# Patient Record
Sex: Female | Born: 1940
Health system: Southern US, Community
[De-identification: ages and names within clinical notes are randomized; demographics above are authoritative.]

## PROBLEM LIST (undated history)

## (undated) DIAGNOSIS — D649 Anemia, unspecified: Secondary | ICD-10-CM

## (undated) DIAGNOSIS — E785 Hyperlipidemia, unspecified: Secondary | ICD-10-CM

## (undated) DIAGNOSIS — R0600 Dyspnea, unspecified: Secondary | ICD-10-CM

## (undated) DIAGNOSIS — I38 Endocarditis, valve unspecified: Secondary | ICD-10-CM

## (undated) DIAGNOSIS — E039 Hypothyroidism, unspecified: Secondary | ICD-10-CM

## (undated) DIAGNOSIS — E559 Vitamin D deficiency, unspecified: Secondary | ICD-10-CM

## (undated) DIAGNOSIS — F32A Depression, unspecified: Secondary | ICD-10-CM

## (undated) DIAGNOSIS — R51 Headache: Secondary | ICD-10-CM

## (undated) DIAGNOSIS — M810 Age-related osteoporosis without current pathological fracture: Secondary | ICD-10-CM

## (undated) DIAGNOSIS — I509 Heart failure, unspecified: Secondary | ICD-10-CM

## (undated) DIAGNOSIS — I10 Essential (primary) hypertension: Secondary | ICD-10-CM

## (undated) DIAGNOSIS — R519 Headache, unspecified: Secondary | ICD-10-CM

## (undated) DIAGNOSIS — M1990 Unspecified osteoarthritis, unspecified site: Secondary | ICD-10-CM

## (undated) DIAGNOSIS — J189 Pneumonia, unspecified organism: Secondary | ICD-10-CM

## (undated) DIAGNOSIS — E7800 Pure hypercholesterolemia, unspecified: Secondary | ICD-10-CM

## (undated) DIAGNOSIS — J439 Emphysema, unspecified: Secondary | ICD-10-CM

## (undated) DIAGNOSIS — G4730 Sleep apnea, unspecified: Secondary | ICD-10-CM

## (undated) DIAGNOSIS — H9319 Tinnitus, unspecified ear: Secondary | ICD-10-CM

## (undated) DIAGNOSIS — E119 Type 2 diabetes mellitus without complications: Secondary | ICD-10-CM

## (undated) DIAGNOSIS — K219 Gastro-esophageal reflux disease without esophagitis: Secondary | ICD-10-CM

## (undated) DIAGNOSIS — F419 Anxiety disorder, unspecified: Secondary | ICD-10-CM

## (undated) DIAGNOSIS — J449 Chronic obstructive pulmonary disease, unspecified: Secondary | ICD-10-CM

## (undated) DIAGNOSIS — M797 Fibromyalgia: Secondary | ICD-10-CM

## (undated) DIAGNOSIS — F329 Major depressive disorder, single episode, unspecified: Secondary | ICD-10-CM

## (undated) DIAGNOSIS — F0390 Unspecified dementia without behavioral disturbance: Secondary | ICD-10-CM

## (undated) HISTORY — PX: BREAST BIOPSY: SHX20

## (undated) HISTORY — PX: HEAD & NECK SKIN LESION EXCISIONAL BIOPSY: SUR472

## (undated) HISTORY — PX: KNEE SURGERY: SHX244

## (undated) HISTORY — PX: BREAST SURGERY: SHX581

## (undated) HISTORY — PX: OTHER SURGICAL HISTORY: SHX169

## (undated) HISTORY — PX: CHOLECYSTECTOMY: SHX55

## (undated) HISTORY — PX: ABDOMINAL HYSTERECTOMY: SHX81

## (undated) HISTORY — DX: Hyperlipidemia, unspecified: E78.5

## (undated) HISTORY — PX: TONSILLECTOMY: SUR1361

## (undated) HISTORY — PX: EYE SURGERY: SHX253

---

## 2001-01-02 ENCOUNTER — Encounter: Admission: RE | Admit: 2001-01-02 | Discharge: 2001-01-02 | Payer: Self-pay | Admitting: Internal Medicine

## 2011-08-18 ENCOUNTER — Other Ambulatory Visit: Payer: Self-pay | Admitting: *Deleted

## 2011-08-18 MED ORDER — SIMVASTATIN 20 MG PO TABS: 20.0000 mg | ORAL_TABLET | Freq: Every day | ORAL | Status: DC

## 2011-08-18 NOTE — Telephone Encounter (Signed)
Patient has not been see here. Is it okay to fill this?

## 2011-08-18 NOTE — Telephone Encounter (Signed)
OK to fill but she will need to be seen in visit.

## 2011-10-20 ENCOUNTER — Other Ambulatory Visit: Payer: Self-pay | Admitting: Internal Medicine

## 2013-07-05 ENCOUNTER — Encounter (HOSPITAL_BASED_OUTPATIENT_CLINIC_OR_DEPARTMENT_OTHER): Payer: Self-pay | Admitting: Emergency Medicine

## 2013-07-05 ENCOUNTER — Emergency Department (HOSPITAL_BASED_OUTPATIENT_CLINIC_OR_DEPARTMENT_OTHER)
Admission: EM | Admit: 2013-07-05 | Discharge: 2013-07-05 | Disposition: A | Discharge status: Home / Self Care | Payer: Medicare Other | Attending: Emergency Medicine | Admitting: Emergency Medicine

## 2013-07-05 ENCOUNTER — Emergency Department (HOSPITAL_BASED_OUTPATIENT_CLINIC_OR_DEPARTMENT_OTHER): Payer: Medicare Other

## 2013-07-05 DIAGNOSIS — Z87891 Personal history of nicotine dependence: Secondary | ICD-10-CM | POA: Insufficient documentation

## 2013-07-05 DIAGNOSIS — I10 Essential (primary) hypertension: Secondary | ICD-10-CM | POA: Insufficient documentation

## 2013-07-05 DIAGNOSIS — J438 Other emphysema: Secondary | ICD-10-CM | POA: Insufficient documentation

## 2013-07-05 DIAGNOSIS — K5289 Other specified noninfective gastroenteritis and colitis: Secondary | ICD-10-CM | POA: Insufficient documentation

## 2013-07-05 DIAGNOSIS — E7800 Pure hypercholesterolemia, unspecified: Secondary | ICD-10-CM | POA: Insufficient documentation

## 2013-07-05 DIAGNOSIS — E119 Type 2 diabetes mellitus without complications: Secondary | ICD-10-CM | POA: Insufficient documentation

## 2013-07-05 DIAGNOSIS — Z79899 Other long term (current) drug therapy: Secondary | ICD-10-CM | POA: Insufficient documentation

## 2013-07-05 DIAGNOSIS — IMO0002 Reserved for concepts with insufficient information to code with codable children: Secondary | ICD-10-CM | POA: Insufficient documentation

## 2013-07-05 DIAGNOSIS — K219 Gastro-esophageal reflux disease without esophagitis: Secondary | ICD-10-CM | POA: Insufficient documentation

## 2013-07-05 DIAGNOSIS — E039 Hypothyroidism, unspecified: Secondary | ICD-10-CM | POA: Insufficient documentation

## 2013-07-05 DIAGNOSIS — Z90710 Acquired absence of both cervix and uterus: Secondary | ICD-10-CM | POA: Insufficient documentation

## 2013-07-05 DIAGNOSIS — K529 Noninfective gastroenteritis and colitis, unspecified: Secondary | ICD-10-CM

## 2013-07-05 DIAGNOSIS — Z8739 Personal history of other diseases of the musculoskeletal system and connective tissue: Secondary | ICD-10-CM | POA: Insufficient documentation

## 2013-07-05 HISTORY — DX: Emphysema, unspecified: J43.9

## 2013-07-05 HISTORY — DX: Endocarditis, valve unspecified: I38

## 2013-07-05 HISTORY — DX: Age-related osteoporosis without current pathological fracture: M81.0

## 2013-07-05 HISTORY — DX: Type 2 diabetes mellitus without complications: E11.9

## 2013-07-05 HISTORY — DX: Gastro-esophageal reflux disease without esophagitis: K21.9

## 2013-07-05 HISTORY — DX: Fibromyalgia: M79.7

## 2013-07-05 HISTORY — DX: Pure hypercholesterolemia, unspecified: E78.00

## 2013-07-05 HISTORY — DX: Essential (primary) hypertension: I10

## 2013-07-05 HISTORY — DX: Unspecified osteoarthritis, unspecified site: M19.90

## 2013-07-05 HISTORY — DX: Chronic obstructive pulmonary disease, unspecified: J44.9

## 2013-07-05 HISTORY — DX: Hypothyroidism, unspecified: E03.9

## 2013-07-05 LAB — COMPREHENSIVE METABOLIC PANEL
ALK PHOS: 65 U/L (ref 39–117)
ALT: 13 U/L (ref 0–35)
AST: 18 U/L (ref 0–37)
Albumin: 3.4 g/dL — ABNORMAL LOW (ref 3.5–5.2)
BUN: 20 mg/dL (ref 6–23)
CO2: 23 meq/L (ref 19–32)
Calcium: 10.1 mg/dL (ref 8.4–10.5)
Chloride: 100 mEq/L (ref 96–112)
Creatinine, Ser: 1 mg/dL (ref 0.50–1.10)
GFR calc Af Amer: 64 mL/min — ABNORMAL LOW (ref >90)
GFR, EST NON AFRICAN AMERICAN: 55 mL/min — AB (ref >90)
GLUCOSE: 113 mg/dL — AB (ref 70–99)
POTASSIUM: 3.4 meq/L — AB (ref 3.7–5.3)
SODIUM: 141 meq/L (ref 137–147)
TOTAL PROTEIN: 7.7 g/dL (ref 6.0–8.3)

## 2013-07-05 LAB — CBC WITH DIFFERENTIAL/PLATELET
BLASTS: 0 %
Band Neutrophils: 1 % (ref 0–10)
Basophils Absolute: 0 10*3/uL (ref 0.0–0.1)
Basophils Relative: 0 % (ref 0–1)
EOS PCT: 1 % (ref 0–5)
Eosinophils Absolute: 0.1 10*3/uL (ref 0.0–0.7)
HCT: 40.6 % (ref 36.0–46.0)
Hemoglobin: 13.2 g/dL (ref 12.0–15.0)
Lymphocytes Relative: 23 % (ref 12–46)
Lymphs Abs: 1.5 10*3/uL (ref 0.7–4.0)
MCH: 24.6 pg — AB (ref 26.0–34.0)
MCHC: 32.5 g/dL (ref 30.0–36.0)
MCV: 75.7 fL — ABNORMAL LOW (ref 78.0–100.0)
MONOS PCT: 14 % — AB (ref 3–12)
MYELOCYTES: 0 %
Metamyelocytes Relative: 1 %
Monocytes Absolute: 0.9 10*3/uL (ref 0.1–1.0)
NRBC: 0 /100{WBCs}
Neutro Abs: 4.1 10*3/uL (ref 1.7–7.7)
Neutrophils Relative %: 60 % (ref 43–77)
PLATELETS: 316 10*3/uL (ref 150–400)
Promyelocytes Absolute: 0 %
RBC: 5.36 MIL/uL — AB (ref 3.87–5.11)
RDW: 17.7 % — AB (ref 11.5–15.5)
WBC: 6.6 10*3/uL (ref 4.0–10.5)

## 2013-07-05 LAB — URINALYSIS, ROUTINE W REFLEX MICROSCOPIC
Glucose, UA: NEGATIVE mg/dL
HGB URINE DIPSTICK: NEGATIVE
KETONES UR: 15 mg/dL — AB
Leukocytes, UA: NEGATIVE
NITRITE: NEGATIVE
PROTEIN: 100 mg/dL — AB
SPECIFIC GRAVITY, URINE: 1.038 — AB (ref 1.005–1.030)
Urobilinogen, UA: 1 mg/dL (ref 0.0–1.0)
pH: 6 (ref 5.0–8.0)

## 2013-07-05 LAB — URINE MICROSCOPIC-ADD ON

## 2013-07-05 LAB — LIPASE, BLOOD: LIPASE: 10 U/L — AB (ref 11–59)

## 2013-07-05 MED ORDER — ONDANSETRON 8 MG PO TBDP: 8.0000 mg | ORAL_TABLET | Freq: Three times a day (TID) | ORAL | Status: DC | PRN

## 2013-07-05 MED ORDER — IOHEXOL 300 MG/ML  SOLN
50.0000 mL | Freq: Once | INTRAMUSCULAR | Status: AC | PRN
  Administered 2013-07-05: 50 mL via ORAL

## 2013-07-05 MED ORDER — SODIUM CHLORIDE 0.9 % IV BOLUS (SEPSIS)
1000.0000 mL | Freq: Once | INTRAVENOUS | Status: AC
  Administered 2013-07-05: 1000 mL via INTRAVENOUS

## 2013-07-05 MED ORDER — ONDANSETRON HCL 4 MG/2ML IJ SOLN
4.0000 mg | Freq: Once | INTRAMUSCULAR | Status: AC
  Administered 2013-07-05: 4 mg via INTRAVENOUS
  Filled 2013-07-05: qty 2

## 2013-07-05 MED ORDER — IOHEXOL 300 MG/ML  SOLN
100.0000 mL | Freq: Once | INTRAMUSCULAR | Status: AC | PRN
  Administered 2013-07-05: 100 mL via INTRAVENOUS

## 2013-07-05 MED ORDER — PANTOPRAZOLE SODIUM 40 MG IV SOLR: 40.0000 mg | Freq: Once | INTRAVENOUS | Status: DC

## 2013-07-05 NOTE — ED Notes (Signed)
N/V/D since Wednesday.  Pt had one episode of V/D since this am.  Some fever.

## 2013-07-05 NOTE — ED Provider Notes (Signed)
CSN: [id number]     Arrival date & time 07/05/13  1755 History  This chart was scribed for Hilario Quarry, MD by Blanchard Kelch, ED Scribe. The patient was seen in room MH10/MH10. Patient's care was started at 6:31 PM.    Chief Complaint  Patient presents with  . Emesis  . Diarrhea  . Chest Pain    Patient is a 73 y.o. female presenting with vomiting and diarrhea. The history is provided by the patient. No language interpreter was used.  Emesis Duration:  3 days Timing:  Constant Chronicity:  New Recent urination:  Normal Associated symptoms: diarrhea   Diarrhea:    Quality:  Watery   Duration:  3 days   Timing:  Constant   Progression:  Unchanged Risk factors: diabetes   Risk factors: no sick contacts   Diarrhea Associated symptoms: vomiting     HPI Comments: Emily Cross is a 73 y.o. female who presents to the Emergency Department complaining of constant, unchanged emesis and diarrhea that began three days ago. She describes the diarrhea as yellow, non-bloody and non-mucosal. She reports intermittent subjective fever and an episode of epistaxis that occurred this morning. She denies dysuria, hematuria, blood in stool or frequency. She denies recent antibiotic use or sick contacts. She has a past medical history of DM and takes Metformin for it.   PCP is Dr. Willa Rough at [facility]   Past Medical History  Diagnosis Date  . GERD (gastroesophageal reflux disease)   . High cholesterol   . Hypertension   . COPD (chronic obstructive pulmonary disease)   . Emphysema of lung   . Arthritis   . Diabetes mellitus without complication   . Fibromyalgia   . Hypothyroidism   . Osteoporosis   . Heart valve regurgitation    Past Surgical History  Procedure Laterality Date  . Joint replacement    . Abdominal hysterectomy     No family history on file. History  Substance Use Topics  . Smoking status: Former Games developer  . Smokeless tobacco: Not on file  . Alcohol Use: Not on file    OB History   Grav Para Term Preterm Abortions TAB SAB Ect Mult Living                 Review of Systems  Gastrointestinal: Positive for vomiting and diarrhea. Negative for blood in stool.  All other systems reviewed and are negative.      Allergies  Asa and Propofol  Home Medications   Current Outpatient Rx  Name  Route  Sig  Dispense  Refill  . albuterol (PROVENTIL HFA;VENTOLIN HFA) 108 (90 BASE) MCG/ACT inhaler   Inhalation   Inhale 2 puffs into the lungs every 6 (six) hours as needed for wheezing or shortness of breath.         Marland Kitchen atorvastatin (LIPITOR) 10 MG tablet   Oral   Take 10 mg by mouth daily.         Marland Kitchen donepezil (ARICEPT) 23 MG TABS tablet   Oral   Take 23 mg by mouth at bedtime.         Marland Kitchen esomeprazole (NEXIUM) 40 MG capsule   Oral   Take 40 mg by mouth daily at 12 noon.         . fluticasone (FLOVENT DISKUS) 50 MCG/BLIST diskus inhaler   Inhalation   Inhale 1 puff into the lungs 2 (two) times daily.         . Fluticasone-Salmeterol (  ADVAIR) 250-50 MCG/DOSE AEPB   Inhalation   Inhale 1 puff into the lungs 2 (two) times daily.         . furosemide (LASIX) 40 MG tablet   Oral   Take 40 mg by mouth.         . levothyroxine (SYNTHROID, LEVOTHROID) 175 MCG tablet   Oral   Take 175 mcg by mouth daily before breakfast.         . loratadine (CLARITIN) 10 MG tablet   Oral   Take 10 mg by mouth daily.         Marland Kitchen. losartan (COZAAR) 25 MG tablet   Oral   Take 25 mg by mouth daily.         . metFORMIN (GLUCOPHAGE) 1000 MG tablet   Oral   Take 1,000 mg by mouth 2 (two) times daily with a meal.         . potassium chloride (KLOR-CON) 20 MEQ packet   Oral   Take 20 mEq by mouth 2 (two) times daily.         . pregabalin (LYRICA) 100 MG capsule   Oral   Take 100 mg by mouth 2 (two) times daily.         . ranitidine (ZANTAC) 300 MG capsule   Oral   Take 300 mg by mouth every evening.         . venlafaxine (EFFEXOR) 37.5  MG tablet   Oral   Take 37.5 mg by mouth 2 (two) times daily.          Triage Vitals: BP 130/68  Pulse 54  Temp(Src) 98.2 F (36.8 C) (Oral)  Resp 16  Ht 5' (1.524 m)  Wt 232 lb (105.235 kg)  BMI 45.31 kg/m2  SpO2 100%  Physical Exam  Constitutional: She is oriented to person, place, and time. She appears well-developed and well-nourished.  HENT:  Head: Normocephalic and atraumatic.  Eyes: Conjunctivae and EOM are normal. Pupils are equal, round, and reactive to light.  Neck: Normal range of motion. Neck supple.  Cardiovascular: Normal rate, regular rhythm and normal heart sounds.   Pulmonary/Chest: Effort normal and breath sounds normal. No respiratory distress. She has no wheezes. She has no rales. She exhibits no tenderness.  Abdominal: Soft. Bowel sounds are normal. There is no tenderness. There is no rebound and no guarding.  Musculoskeletal: Normal range of motion. She exhibits no edema.  Lymphadenopathy:    She has no cervical adenopathy.  Neurological: She is alert and oriented to person, place, and time.  Skin: Skin is warm and dry. No rash noted.  Psychiatric: She has a normal mood and affect.    ED Course  Procedures (including critical care time)  DIAGNOSTIC STUDIES: Oxygen Saturation is 100% on room air, normal by my interpretation.    COORDINATION OF CARE: 6:37 PM -Will order CBC, CMP, Blood Lipase and Urinalysis. Patient verbalizes understanding and agrees with treatment plan.    Labs Review Labs Reviewed  CBC WITH DIFFERENTIAL - Abnormal; Notable for the following:    RBC 5.36 (*)    MCV 75.7 (*)    MCH 24.6 (*)    RDW 17.7 (*)    Monocytes Relative 14 (*)    All other components within normal limits  COMPREHENSIVE METABOLIC PANEL - Abnormal; Notable for the following:    Potassium 3.4 (*)    Glucose, Bld 113 (*)    Albumin 3.4 (*)    Total Bilirubin <0.2 (*)  GFR calc non Af Amer 55 (*)    GFR calc Af Amer 64 (*)    All other components  within normal limits  LIPASE, BLOOD - Abnormal; Notable for the following:    Lipase 10 (*)    All other components within normal limits  URINALYSIS, ROUTINE W REFLEX MICROSCOPIC   Imaging Review No results found.   EKG Interpretation   Date/Time:  Saturday July 05 2013 18:05:14 EDT Ventricular Rate:  104 PR Interval:  134 QRS Duration: 68 QT Interval:  364 QTC Calculation: 478 R Axis:   19 Text Interpretation:  Sinus tachycardia Minimal voltage criteria for LVH,  may be normal variant Nonspecific T wave abnormality Abnormal ECG  Confirmed by Marrufo MD, Duwayne Heck ([id number]) on 07/05/2013 6:09:22 PM      MDM   Final diagnoses:  None    Patient presents with n/v/d x 2 days.  Mild diffuse ttp of abdomen.  Patient hydrated here and given antiemetics.  No vomiting or diarrhea here.  Patient feels improved.  I discussed lab and radiology results with patient and son.  Return precautions discussed and patient voices understanding and to hold metformin for two days.  She will return if worse o/w to f/u pmd Monday.     Hilario Quarry, MD 07/05/13 2051

## 2013-07-05 NOTE — ED Notes (Signed)
Patient states that she went to [facility] for her N/V/D and labs were drawn and she stated that she did not feel that they were addressing her symptoms and she she left and came here.

## 2013-07-05 NOTE — Discharge Instructions (Signed)
Viral Gastroenteritis Viral gastroenteritis is also known as stomach flu. This condition affects the stomach and intestinal tract. It can cause sudden diarrhea and vomiting. The illness typically lasts 3 to 8 days. Most people develop an immune response that eventually gets rid of the virus. While this natural response develops, the virus can make you quite ill. CAUSES  Many different viruses can cause gastroenteritis, such as rotavirus or noroviruses. You can catch one of these viruses by consuming contaminated food or water. You may also catch a virus by sharing utensils or other personal items with an infected person or by touching a contaminated surface. SYMPTOMS  The most common symptoms are diarrhea and vomiting. These problems can cause a severe loss of body fluids (dehydration) and a body salt (electrolyte) imbalance. Other symptoms may include:  Fever.  Headache.  Fatigue.  Abdominal pain. DIAGNOSIS  Your caregiver can usually diagnose viral gastroenteritis based on your symptoms and a physical exam. A stool sample may also be taken to test for the presence of viruses or other infections. TREATMENT  This illness typically goes away on its own. Treatments are aimed at rehydration. The most serious cases of viral gastroenteritis involve vomiting so severely that you are not able to keep fluids down. In these cases, fluids must be given through an intravenous line (IV). HOME CARE INSTRUCTIONS   Drink enough fluids to keep your urine clear or pale yellow. Drink small amounts of fluids frequently and increase the amounts as tolerated.  Ask your caregiver for specific rehydration instructions.  Avoid:  Foods high in sugar.  Alcohol.  Carbonated drinks.  Tobacco.  Juice.  Caffeine drinks.  Extremely hot or cold fluids.  Fatty, greasy foods.  Too much intake of anything at one time.  Dairy products until 24 to 48 hours after diarrhea stops.  You may consume probiotics.  Probiotics are active cultures of beneficial bacteria. They may lessen the amount and number of diarrheal stools in adults. Probiotics can be found in yogurt with active cultures and in supplements.  Wash your hands well to avoid spreading the virus.  Only take over-the-counter or prescription medicines for pain, discomfort, or fever as directed by your caregiver. Do not give aspirin to children. Antidiarrheal medicines are not recommended.  Ask your caregiver if you should continue to take your regular prescribed and over-the-counter medicines.  Keep all follow-up appointments as directed by your caregiver. SEEK IMMEDIATE MEDICAL CARE IF:   You are unable to keep fluids down.  You do not urinate at least once every 6 to 8 hours.  You develop shortness of breath.  You notice blood in your stool or vomit. This may look like coffee grounds.  You have abdominal pain that increases or is concentrated in one small area (localized).  You have persistent vomiting or diarrhea.  You have a fever.  The patient is a child younger than 3 months, and he or she has a fever.  The patient is a child older than 3 months, and he or she has a fever and persistent symptoms.  The patient is a child older than 3 months, and he or she has a fever and symptoms suddenly get worse.  The patient is a baby, and he or she has no tears when crying. MAKE SURE YOU:   Understand these instructions.  Will watch your condition.  Will get help right away if you are not doing well or get worse. Document Released: 04/03/2005 Document Revised: 06/26/2011 Document Reviewed: 01/18/2011   ExitCare Patient Information 2014 [employer].  

## 2014-07-17 HISTORY — PX: COLONOSCOPY: SHX174

## 2014-10-21 ENCOUNTER — Emergency Department (HOSPITAL_BASED_OUTPATIENT_CLINIC_OR_DEPARTMENT_OTHER): Payer: Medicare Other

## 2014-10-21 ENCOUNTER — Emergency Department (HOSPITAL_BASED_OUTPATIENT_CLINIC_OR_DEPARTMENT_OTHER)
Admission: EM | Admit: 2014-10-21 | Discharge: 2014-10-21 | Disposition: A | Discharge status: Other Acute Inpatient Hospital | Payer: Medicare Other | Attending: Emergency Medicine | Admitting: Emergency Medicine

## 2014-10-21 ENCOUNTER — Encounter (HOSPITAL_BASED_OUTPATIENT_CLINIC_OR_DEPARTMENT_OTHER): Payer: Self-pay | Admitting: *Deleted

## 2014-10-21 DIAGNOSIS — M1990 Unspecified osteoarthritis, unspecified site: Secondary | ICD-10-CM | POA: Insufficient documentation

## 2014-10-21 DIAGNOSIS — Z7951 Long term (current) use of inhaled steroids: Secondary | ICD-10-CM | POA: Insufficient documentation

## 2014-10-21 DIAGNOSIS — R0602 Shortness of breath: Secondary | ICD-10-CM | POA: Diagnosis present

## 2014-10-21 DIAGNOSIS — R000 Tachycardia, unspecified: Secondary | ICD-10-CM | POA: Insufficient documentation

## 2014-10-21 DIAGNOSIS — R609 Edema, unspecified: Secondary | ICD-10-CM | POA: Insufficient documentation

## 2014-10-21 DIAGNOSIS — K219 Gastro-esophageal reflux disease without esophagitis: Secondary | ICD-10-CM | POA: Insufficient documentation

## 2014-10-21 DIAGNOSIS — I509 Heart failure, unspecified: Secondary | ICD-10-CM | POA: Diagnosis not present

## 2014-10-21 DIAGNOSIS — Z79899 Other long term (current) drug therapy: Secondary | ICD-10-CM | POA: Insufficient documentation

## 2014-10-21 DIAGNOSIS — E039 Hypothyroidism, unspecified: Secondary | ICD-10-CM | POA: Insufficient documentation

## 2014-10-21 DIAGNOSIS — E119 Type 2 diabetes mellitus without complications: Secondary | ICD-10-CM | POA: Insufficient documentation

## 2014-10-21 DIAGNOSIS — Z87891 Personal history of nicotine dependence: Secondary | ICD-10-CM | POA: Diagnosis not present

## 2014-10-21 DIAGNOSIS — M797 Fibromyalgia: Secondary | ICD-10-CM | POA: Diagnosis not present

## 2014-10-21 DIAGNOSIS — I10 Essential (primary) hypertension: Secondary | ICD-10-CM | POA: Insufficient documentation

## 2014-10-21 DIAGNOSIS — J441 Chronic obstructive pulmonary disease with (acute) exacerbation: Secondary | ICD-10-CM | POA: Insufficient documentation

## 2014-10-21 DIAGNOSIS — E780 Pure hypercholesterolemia: Secondary | ICD-10-CM | POA: Insufficient documentation

## 2014-10-21 LAB — TROPONIN I

## 2014-10-21 LAB — BASIC METABOLIC PANEL
Anion gap: 7 (ref 5–15)
BUN: 14 mg/dL (ref 6–20)
CALCIUM: 9.7 mg/dL (ref 8.9–10.3)
CO2: 27 mmol/L (ref 22–32)
Chloride: 105 mmol/L (ref 101–111)
Creatinine, Ser: 0.83 mg/dL (ref 0.44–1.00)
GFR calc Af Amer: >60 mL/min (ref >60)
GLUCOSE: 130 mg/dL — AB (ref 65–99)
POTASSIUM: 3.8 mmol/L (ref 3.5–5.1)
Sodium: 139 mmol/L (ref 135–145)

## 2014-10-21 LAB — CBC WITH DIFFERENTIAL/PLATELET
BASOS PCT: 0 % (ref 0–1)
Basophils Absolute: 0 10*3/uL (ref 0.0–0.1)
Eosinophils Absolute: 0.3 10*3/uL (ref 0.0–0.7)
Eosinophils Relative: 3 % (ref 0–5)
HCT: 32.5 % — ABNORMAL LOW (ref 36.0–46.0)
Hemoglobin: 9.9 g/dL — ABNORMAL LOW (ref 12.0–15.0)
LYMPHS ABS: 2.3 10*3/uL (ref 0.7–4.0)
Lymphocytes Relative: 26 % (ref 12–46)
MCH: 22.6 pg — ABNORMAL LOW (ref 26.0–34.0)
MCHC: 30.5 g/dL (ref 30.0–36.0)
MCV: 74 fL — AB (ref 78.0–100.0)
MONOS PCT: 8 % (ref 3–12)
Monocytes Absolute: 0.7 10*3/uL (ref 0.1–1.0)
NEUTROS ABS: 5.4 10*3/uL (ref 1.7–7.7)
NEUTROS PCT: 63 % (ref 43–77)
PLATELETS: 316 10*3/uL (ref 150–400)
RBC: 4.39 MIL/uL (ref 3.87–5.11)
RDW: 24 % — AB (ref 11.5–15.5)
WBC: 8.7 10*3/uL (ref 4.0–10.5)

## 2014-10-21 LAB — BRAIN NATRIURETIC PEPTIDE: B Natriuretic Peptide: 317.5 pg/mL — ABNORMAL HIGH (ref 0.0–100.0)

## 2014-10-21 MED ORDER — FUROSEMIDE 10 MG/ML IJ SOLN
40.0000 mg | Freq: Once | INTRAMUSCULAR | Status: AC
  Administered 2014-10-21: 40 mg via INTRAVENOUS
  Filled 2014-10-21: qty 4

## 2014-10-21 MED ORDER — ALBUTEROL SULFATE (2.5 MG/3ML) 0.083% IN NEBU
5.0000 mg | INHALATION_SOLUTION | Freq: Once | RESPIRATORY_TRACT | Status: AC
  Administered 2014-10-21: 5 mg via RESPIRATORY_TRACT
  Filled 2014-10-21: qty 6

## 2014-10-21 NOTE — ED Provider Notes (Signed)
CSN: [id number]     Arrival date & time 10/21/14  1029 History   First MD Initiated Contact with Patient 10/21/14 1053     No chief complaint on file.    (Consider location/radiation/quality/duration/timing/severity/associated sxs/prior Treatment) Patient is a 74 y.o. female presenting with shortness of breath.  Shortness of Breath Severity:  Severe Onset quality:  Gradual Duration: several weeks, worse today. Timing:  Constant Progression:  Worsening Chronicity:  Recurrent Context comment:  Required hospitalization several months ago for CHF. Relieved by:  Rest Worsened by:  Exertion Ineffective treatments:  Diuretics (40 mg Lasix daily) Associated symptoms: cough and diaphoresis   Associated symptoms: no chest pain and no fever     Past Medical History  Diagnosis Date  . GERD (gastroesophageal reflux disease)   . High cholesterol   . Hypertension   . COPD (chronic obstructive pulmonary disease)   . Emphysema of lung   . Arthritis   . Diabetes mellitus without complication   . Fibromyalgia   . Hypothyroidism   . Osteoporosis   . Heart valve regurgitation    Past Surgical History  Procedure Laterality Date  . Joint replacement    . Abdominal hysterectomy     No family history on file. History  Substance Use Topics  . Smoking status: Former Games developer  . Smokeless tobacco: Not on file  . Alcohol Use: Not on file   OB History    No data available     Review of Systems  Constitutional: Positive for diaphoresis. Negative for fever.  Respiratory: Positive for cough and shortness of breath.   Cardiovascular: Negative for chest pain.  All other systems reviewed and are negative.     Allergies  Asa and Propofol  Home Medications   Prior to Admission medications   Medication Sig Start Date End Date Taking? Authorizing Provider  albuterol (PROVENTIL HFA;VENTOLIN HFA) 108 (90 BASE) MCG/ACT inhaler Inhale 2 puffs into the lungs every 6 (six) hours as needed for  wheezing or shortness of breath.   Yes Historical Provider, MD  atorvastatin (LIPITOR) 10 MG tablet Take 10 mg by mouth daily.   Yes Historical Provider, MD  esomeprazole (NEXIUM) 40 MG capsule Take 40 mg by mouth daily at 12 noon.   Yes Historical Provider, MD  fluticasone (FLOVENT DISKUS) 50 MCG/BLIST diskus inhaler Inhale 1 puff into the lungs 2 (two) times daily.   Yes Historical Provider, MD  Fluticasone-Salmeterol (ADVAIR) 250-50 MCG/DOSE AEPB Inhale 1 puff into the lungs 2 (two) times daily.   Yes Historical Provider, MD  furosemide (LASIX) 40 MG tablet Take 40 mg by mouth.   Yes Historical Provider, MD  isosorbide dinitrate (ISORDIL) 20 MG tablet Take 20 mg by mouth 3 (three) times daily.   Yes Historical Provider, MD  levothyroxine (SYNTHROID, LEVOTHROID) 175 MCG tablet Take 175 mcg by mouth daily before breakfast.   Yes Historical Provider, MD  loratadine (CLARITIN) 10 MG tablet Take 10 mg by mouth daily.   Yes Historical Provider, MD  losartan (COZAAR) 25 MG tablet Take 25 mg by mouth daily.   Yes Historical Provider, MD  memantine (NAMENDA) 5 MG tablet Take 7 mg by mouth 2 (two) times daily.   Yes Historical Provider, MD  metFORMIN (GLUCOPHAGE) 1000 MG tablet Take 1,000 mg by mouth 2 (two) times daily with a meal.   Yes Historical Provider, MD  nystatin cream (MYCOSTATIN) Apply 1 application topically 2 (two) times daily.   Yes Historical Provider, MD  potassium chloride (KLOR-CON)  20 MEQ packet Take 20 mEq by mouth 2 (two) times daily.   Yes Historical Provider, MD  pregabalin (LYRICA) 100 MG capsule Take 100 mg by mouth 2 (two) times daily.   Yes Historical Provider, MD  venlafaxine (EFFEXOR) 37.5 MG tablet Take 37.5 mg by mouth 2 (two) times daily.   Yes Historical Provider, MD  ondansetron (ZOFRAN ODT) 8 MG disintegrating tablet Take 1 tablet (8 mg total) by mouth every 8 (eight) hours as needed for nausea or vomiting. 07/05/13   Margarita Grizzle, MD   BP 136/79 mmHg  Pulse 102   Temp(Src) 98.1 F (36.7 C) (Oral)  Resp 20  Ht  (1.499 m)  Wt 260 lb (117.935 kg)  BMI 52.49 kg/m2  SpO2 97% Physical Exam  Constitutional: She is oriented to person, place, and time. She appears well-developed and well-nourished. No distress.  HENT:  Head: Normocephalic and atraumatic.  Mouth/Throat: Oropharynx is clear and moist.  Eyes: Conjunctivae are normal. Pupils are equal, round, and reactive to light. No scleral icterus.  Neck: Neck supple.  Cardiovascular: Regular rhythm, normal heart sounds and intact distal pulses.  Tachycardia present.   No murmur heard. Pulmonary/Chest: Effort normal. No stridor. No respiratory distress. She has decreased breath sounds (Bilateral. Worse in bases.). She has no wheezes. She has no rales.  Abdominal: Soft. Bowel sounds are normal. She exhibits no distension. There is no tenderness.  Musculoskeletal: Normal range of motion. She exhibits edema (1+ bilateral lower extremity).  Neurological: She is alert and oriented to person, place, and time.  Skin: Skin is warm and dry. No rash noted.  Psychiatric: She has a normal mood and affect. Her behavior is normal.  Nursing note and vitals reviewed.   ED Course  Procedures (including critical care time) Labs Review Labs Reviewed  BASIC METABOLIC PANEL - Abnormal; Notable for the following:    Glucose, Bld 130 (*)    All other components within normal limits  CBC WITH DIFFERENTIAL/PLATELET - Abnormal; Notable for the following:    Hemoglobin 9.9 (*)    HCT 32.5 (*)    MCV 74.0 (*)    MCH 22.6 (*)    RDW 24.0 (*)    All other components within normal limits  BRAIN NATRIURETIC PEPTIDE - Abnormal; Notable for the following:    B Natriuretic Peptide 317.5 (*)    All other components within normal limits  TROPONIN I    Imaging Review Dg Chest 2 View  10/21/2014   CLINICAL DATA:  Shortness of breath.  EXAM: CHEST  2 VIEW  COMPARISON:  August 13, 2014  FINDINGS: There is mild cardiomegaly  with pulmonary venous hypertension. There is no frank edema or consolidation. No adenopathy. No bone lesions.  IMPRESSION: Evidence of a degree of pulmonary vascular congestion without frank edema or consolidation.   Electronically Signed   By: Bretta Bang III M.D.   On: 10/21/2014 11:28  All radiology studies independently viewed by me.      EKG Interpretation   Date/Time:  Wednesday October 21 2014 10:44:05 EDT Ventricular Rate:  111 PR Interval:  152 QRS Duration: 70 QT Interval:  350 QTC Calculation: 476 R Axis:   46 Text Interpretation:  Sinus tachycardia Nonspecific T wave abnormality  Abnormal ECG No significant change was found Confirmed by [facility]  MD,  TREY ([id number]) on 10/21/2014 2:41:54 PM      MDM   Final diagnoses:  Shortness of breath  Acute exacerbation of CHF (congestive heart failure)  74 year old female shortness of breath. History of CHF. Symptoms likely secondary to CHF with component of reactive airway disease. She was given albuterol, which helps slightly. However, then ambulated and became severely dyspneic.  Plan IV Lasix and admission. [facility] with [facility].    Blake DivineJohn Helma Argyle, MD 10/21/14 [phone]

## 2014-10-21 NOTE — ED Notes (Signed)
[facility]. Care turned over to [facility].

## 2014-10-21 NOTE — ED Notes (Signed)
[facility].

## 2014-10-21 NOTE — ED Notes (Signed)
[facility] at bedside for transport. 

## 2014-10-21 NOTE — ED Notes (Signed)
C/o sob and body aches in waist up. [facility].

## 2014-10-21 NOTE — ED Notes (Signed)
[facility]

## 2014-10-21 NOTE — ED Notes (Signed)
Patient ambulated in hallway ~30 yards, HR 130-140, RR 26-30 shallow, BBS increased upper airway wheezes, SpO2 90-95%, increased DOE.  Patient returned to room via w/c.  Returned to monitor vitals checked and recorded.

## 2014-10-21 NOTE — ED Notes (Signed)
Voided 100cc in bedpan. Partially urinated on floor.

## 2015-04-25 IMAGING — CT CT ABD-PELV W/ CM
2 of 5 series · 17 of 46 positions shown, 19 images · IV contrast (omnipaque)
Comparison: 01/29/2004

CLINICAL DATA: Abdominal pain.  Nausea and vomiting.  Fever.

EXAM:
CT ABDOMEN AND PELVIS WITH CONTRAST
TECHNIQUE: Multidetector CT imaging of the abdomen and pelvis was performed
using the standard protocol following bolus administration of
intravenous contrast.
CONTRAST:  100mL OMNIPAQUE IOHEXOL 300 MG/ML  SOLN

[Series 2: abd/pelvis 5.0 b31f · axial · 0.95mm/px · z∈[+705,+1085]mm · 14 of 86 slices shown, 16 images]
[im 5/86  soft-tissue]
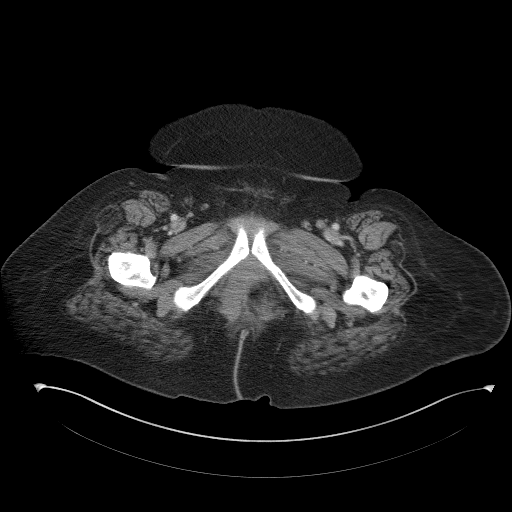
[im 5/86  bone]
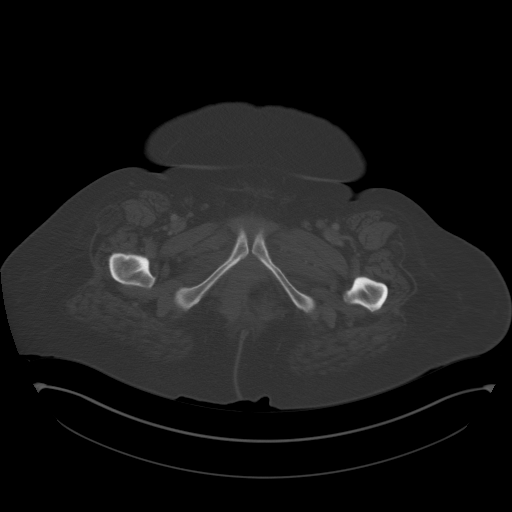
[im 10/86  soft-tissue]
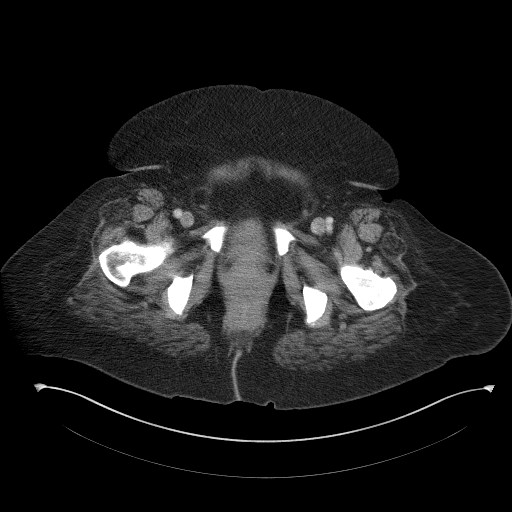
[im 19/86  soft-tissue]
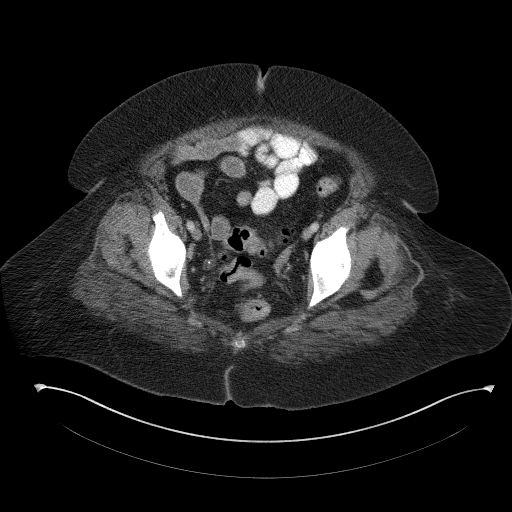
[im 24/86  soft-tissue]
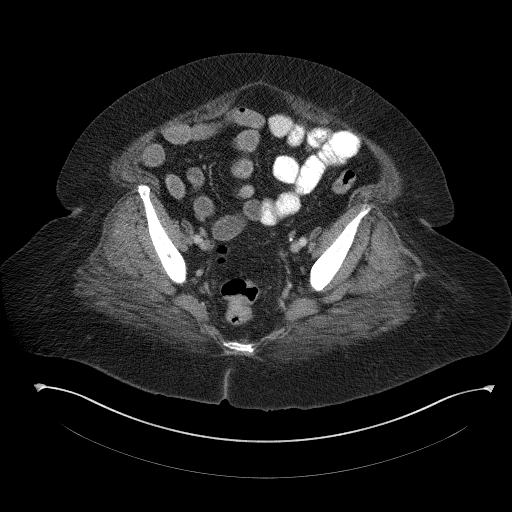
[im 29/86  soft-tissue]
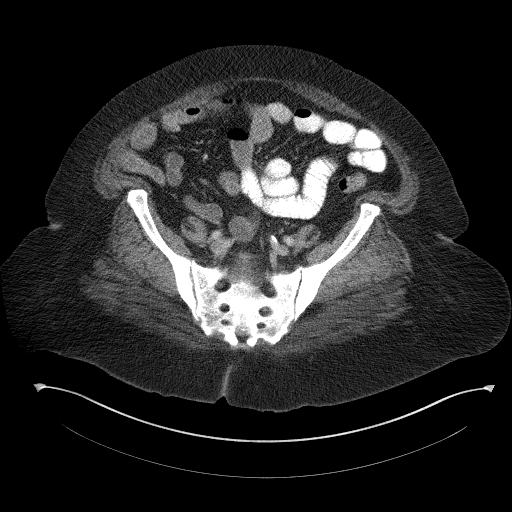
[im 34/86  soft-tissue]
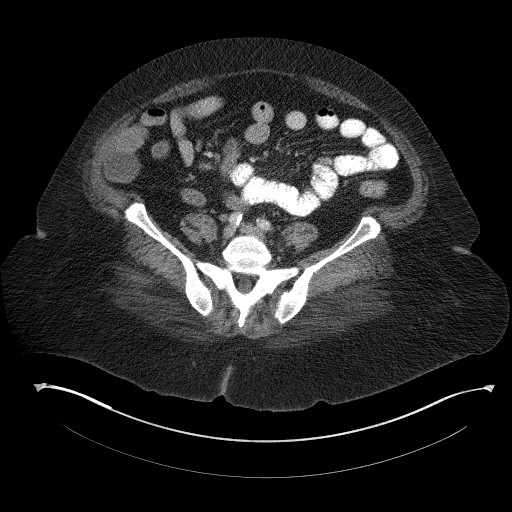
[im 38/86  soft-tissue]
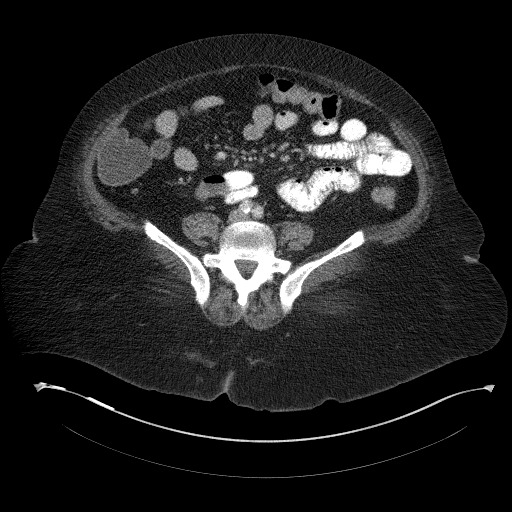
[im 48/86  soft-tissue]
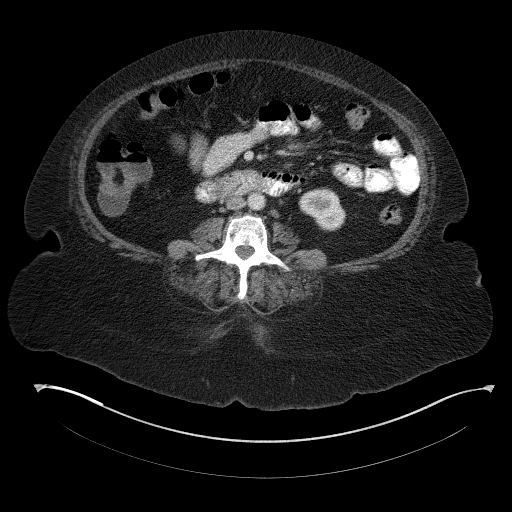
[im 52/86  soft-tissue]
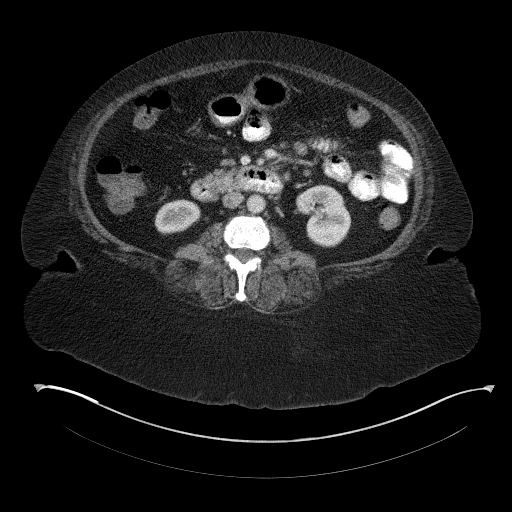
[im 52/86  bone]
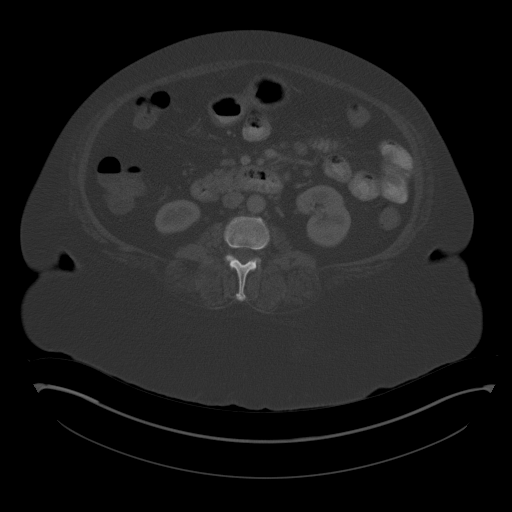
[im 57/86  soft-tissue]
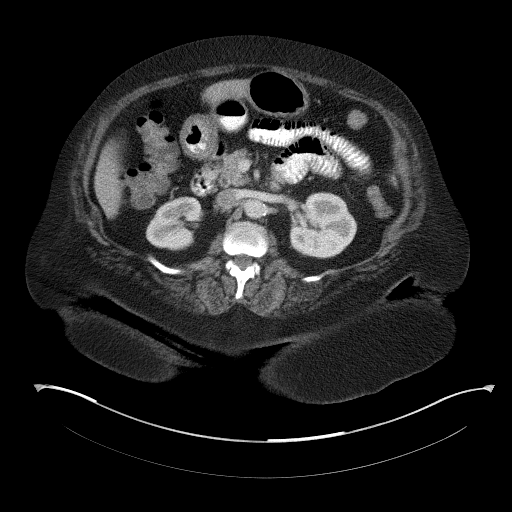
[im 62/86  soft-tissue]
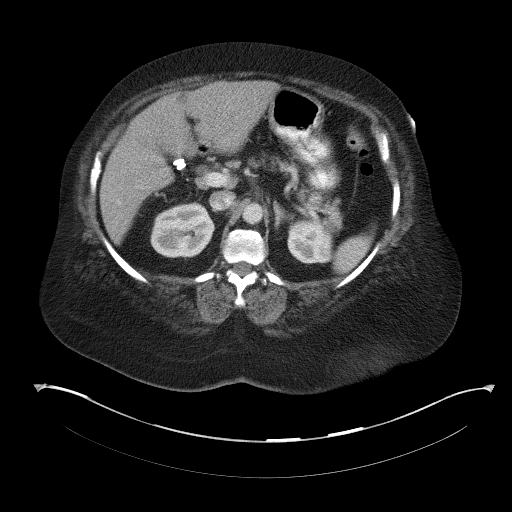
[im 67/86  soft-tissue]
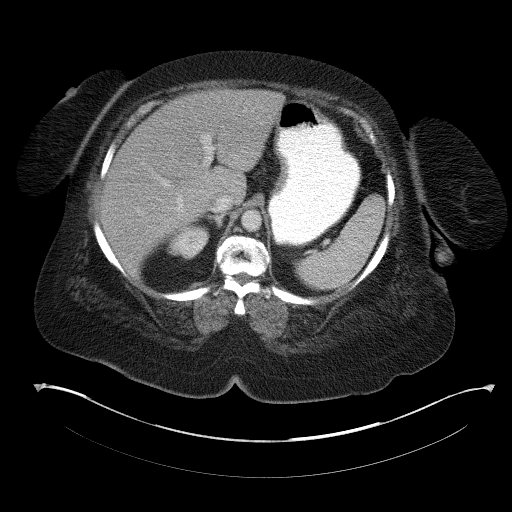
[im 76/86  soft-tissue]
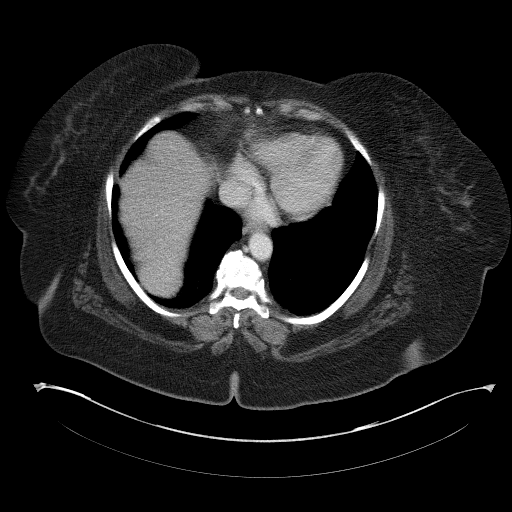
[im 81/86  soft-tissue]
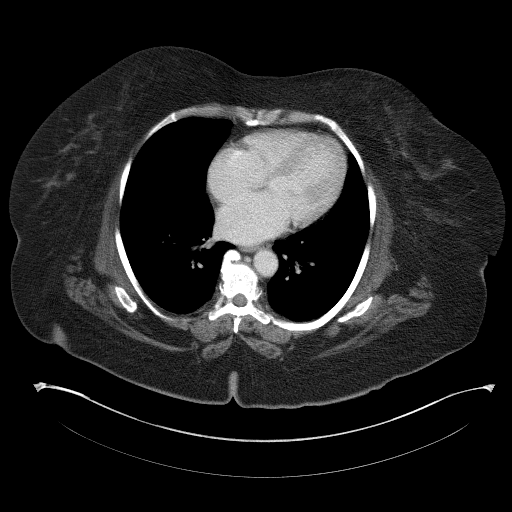

[Series 5: abd/pelvis 3.0 coronal · coronal · 0.93mm/px · 3 of 98 slices shown]
[im 33/98  soft-tissue]
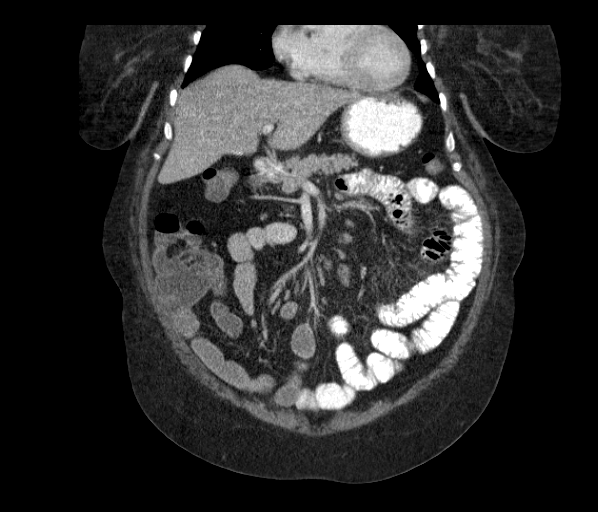
[im 44/98  soft-tissue]
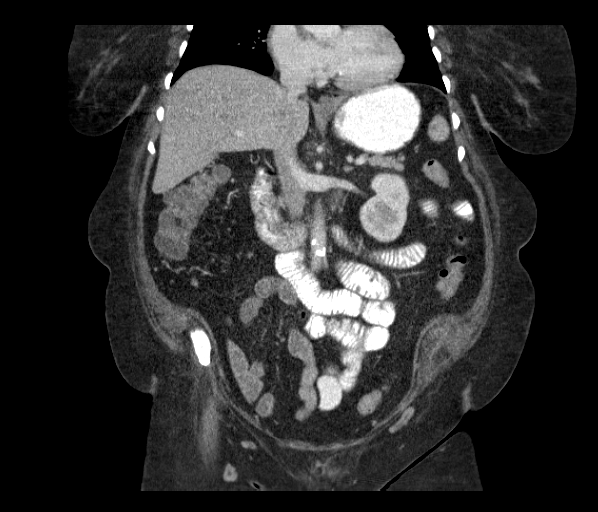
[im 54/98  soft-tissue]
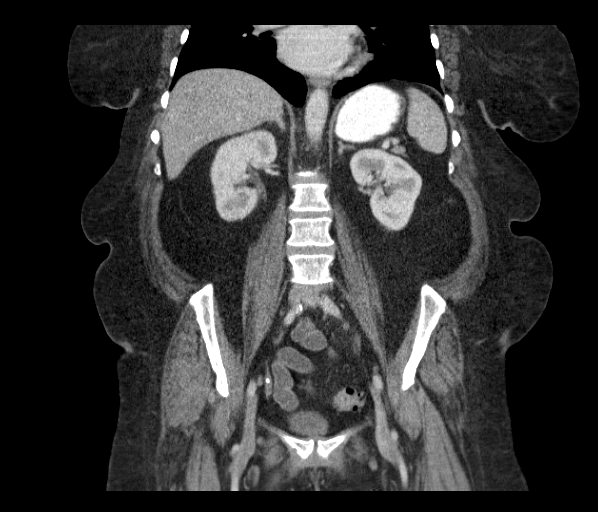

[17 of 46 positions shown; findings below may reference images not displayed]

FINDINGS: Surgical clips from prior cholecystectomy noted. No evidence of
biliary ductal dilatation. No evidence of liver masses. The
pancreas, spleen, adrenal glands, and kidneys are normal in
appearance. No evidence hydronephrosis. No soft tissue masses or
lymphadenopathy identified within the abdomen or pelvis. Prior
hysterectomy noted. Adnexal regions are unremarkable.

Mild wall thickening is seen involving the gastric antrum, which may
be due to underdistention although antral gastritis cannot be
excluded. No sites of bowel wall thickening or dilatation seen. No
other inflammatory process or abnormal fluid collections identified.
IMPRESSION: Mild wall thickening of gastric antrum. This could be due to
underdistention, however antral gastritis cannot be excluded.

No other acute findings or significant abnormality identified.

## 2016-02-11 ENCOUNTER — Encounter (HOSPITAL_BASED_OUTPATIENT_CLINIC_OR_DEPARTMENT_OTHER): Payer: Self-pay | Admitting: Emergency Medicine

## 2016-02-11 ENCOUNTER — Emergency Department (HOSPITAL_BASED_OUTPATIENT_CLINIC_OR_DEPARTMENT_OTHER)
Admission: EM | Admit: 2016-02-11 | Discharge: 2016-02-11 | Disposition: A | Discharge status: Home / Self Care | Payer: Medicare Other | Attending: Emergency Medicine | Admitting: Emergency Medicine

## 2016-02-11 ENCOUNTER — Emergency Department (HOSPITAL_BASED_OUTPATIENT_CLINIC_OR_DEPARTMENT_OTHER): Payer: Medicare Other

## 2016-02-11 DIAGNOSIS — I110 Hypertensive heart disease with heart failure: Secondary | ICD-10-CM | POA: Insufficient documentation

## 2016-02-11 DIAGNOSIS — E039 Hypothyroidism, unspecified: Secondary | ICD-10-CM | POA: Diagnosis not present

## 2016-02-11 DIAGNOSIS — Z79899 Other long term (current) drug therapy: Secondary | ICD-10-CM | POA: Diagnosis not present

## 2016-02-11 DIAGNOSIS — M25561 Pain in right knee: Secondary | ICD-10-CM | POA: Insufficient documentation

## 2016-02-11 DIAGNOSIS — M545 Low back pain, unspecified: Secondary | ICD-10-CM

## 2016-02-11 DIAGNOSIS — Z7984 Long term (current) use of oral hypoglycemic drugs: Secondary | ICD-10-CM | POA: Diagnosis not present

## 2016-02-11 DIAGNOSIS — J449 Chronic obstructive pulmonary disease, unspecified: Secondary | ICD-10-CM | POA: Insufficient documentation

## 2016-02-11 DIAGNOSIS — E119 Type 2 diabetes mellitus without complications: Secondary | ICD-10-CM | POA: Diagnosis not present

## 2016-02-11 DIAGNOSIS — I509 Heart failure, unspecified: Secondary | ICD-10-CM | POA: Insufficient documentation

## 2016-02-11 DIAGNOSIS — Z87891 Personal history of nicotine dependence: Secondary | ICD-10-CM | POA: Insufficient documentation

## 2016-02-11 DIAGNOSIS — G8929 Other chronic pain: Secondary | ICD-10-CM

## 2016-02-11 HISTORY — DX: Heart failure, unspecified: I50.9

## 2016-02-11 LAB — URINALYSIS, ROUTINE W REFLEX MICROSCOPIC
Bilirubin Urine: NEGATIVE
GLUCOSE, UA: NEGATIVE mg/dL
Hgb urine dipstick: NEGATIVE
KETONES UR: NEGATIVE mg/dL
Leukocytes, UA: NEGATIVE
Nitrite: NEGATIVE
PH: 7.5 (ref 5.0–8.0)
PROTEIN: NEGATIVE mg/dL
Specific Gravity, Urine: 1.017 (ref 1.005–1.030)

## 2016-02-11 MED ORDER — HYDROCODONE-ACETAMINOPHEN 5-325 MG PO TABS
2.0000 | ORAL_TABLET | Freq: Once | ORAL | Status: AC
  Administered 2016-02-11: 2 via ORAL
  Filled 2016-02-11: qty 2

## 2016-02-11 MED ORDER — HYDROCODONE-ACETAMINOPHEN 5-325 MG PO TABS: 1.0000 | ORAL_TABLET | Freq: Four times a day (QID) | ORAL | 0 refills | Status: DC | PRN

## 2016-02-11 MED FILL — HYDROCODON-APAP 5-325: 5-325 | 2 days supply | Qty: 20 | Fill #0

## 2016-02-11 NOTE — ED Notes (Signed)
MD at bedside. 

## 2016-02-11 NOTE — ED Provider Notes (Signed)
[facility] Provider Note   CSN: [id number] Arrival date & time: 02/11/16  4098     History   Chief Complaint Chief Complaint  Patient presents with  . Back Pain    HPI Emily Cross is a 75 y.o. female.  Patient is a 75 year old female with history of prior knee surgery, arthritis, diabetes, CHF. She presents today for evaluation of right knee and back pain. She has apparently been experiencing right knee pain for quite some time, however is now worsening. She was on her feet walking for a good bit of time yesterday and began to experience pain in her low back. She denies any injury or trauma otherwise. She denies any bowel or bladder complaints. She reports having had surgery on her knee several years ago. She has had trouble ever since, however this is worsening.      Past Medical History:  Diagnosis Date  . Arthritis   . CHF (congestive heart failure) (HCC)   . COPD (chronic obstructive pulmonary disease) (HCC)   . Diabetes mellitus without complication (HCC)   . Emphysema of lung (HCC)   . Fibromyalgia   . GERD (gastroesophageal reflux disease)   . Heart valve regurgitation   . High cholesterol   . Hypertension   . Hypothyroidism   . Osteoporosis     There are no active problems to display for this patient.   Past Surgical History:  Procedure Laterality Date  . ABDOMINAL HYSTERECTOMY    . BREAST SURGERY    . JOINT REPLACEMENT    . TONSILLECTOMY      OB History    No data available       Home Medications    Prior to Admission medications   Medication Sig Start Date End Date Taking? Authorizing Provider  albuterol (PROVENTIL HFA;VENTOLIN HFA) 108 (90 BASE) MCG/ACT inhaler Inhale 2 puffs into the lungs every 6 (six) hours as needed for wheezing or shortness of breath.   Yes Historical Provider, MD  atorvastatin (LIPITOR) 10 MG tablet Take 10 mg by mouth daily.   Yes Historical Provider, MD  esomeprazole (NEXIUM) 40 MG capsule Take 40 mg by  mouth daily at 12 noon.   Yes Historical Provider, MD  fluticasone (FLOVENT DISKUS) 50 MCG/BLIST diskus inhaler Inhale 1 puff into the lungs 2 (two) times daily.   Yes Historical Provider, MD  Fluticasone-Salmeterol (ADVAIR) 250-50 MCG/DOSE AEPB Inhale 1 puff into the lungs 2 (two) times daily.   Yes Historical Provider, MD  furosemide (LASIX) 40 MG tablet Take 40 mg by mouth.   Yes Historical Provider, MD  isosorbide dinitrate (ISORDIL) 20 MG tablet Take 20 mg by mouth 3 (three) times daily.   Yes Historical Provider, MD  levothyroxine (SYNTHROID, LEVOTHROID) 175 MCG tablet Take 175 mcg by mouth daily before breakfast.   Yes Historical Provider, MD  loratadine (CLARITIN) 10 MG tablet Take 10 mg by mouth daily.   Yes Historical Provider, MD  losartan (COZAAR) 25 MG tablet Take 25 mg by mouth daily.   Yes Historical Provider, MD  memantine (NAMENDA) 5 MG tablet Take 7 mg by mouth 2 (two) times daily.   Yes Historical Provider, MD  metFORMIN (GLUCOPHAGE) 1000 MG tablet Take 1,000 mg by mouth 2 (two) times daily with a meal.   Yes Historical Provider, MD  nystatin cream (MYCOSTATIN) Apply 1 application topically 2 (two) times daily.   Yes Historical Provider, MD  potassium chloride (KLOR-CON) 20 MEQ packet Take 20 mEq by mouth  2 (two) times daily.   Yes Historical Provider, MD  pregabalin (LYRICA) 100 MG capsule Take 100 mg by mouth 2 (two) times daily.   Yes Historical Provider, MD  venlafaxine (EFFEXOR) 37.5 MG tablet Take 37.5 mg by mouth 2 (two) times daily.   Yes Historical Provider, MD  ondansetron (ZOFRAN ODT) 8 MG disintegrating tablet Take 1 tablet (8 mg total) by mouth every 8 (eight) hours as needed for nausea or vomiting. 07/05/13   Margarita Grizzle, MD    Family History No family history on file.  Social History Social History  Substance Use Topics  . Smoking status: Former Games developer  . Smokeless tobacco: Never Used  . Alcohol use No     Allergies   Asa [aspirin] and  Propofol   Review of Systems Review of Systems  All other systems reviewed and are negative.    Physical Exam Updated Vital Signs BP 120/66 (BP Location: Right Arm)   Pulse 110   Temp 98.4 F (36.9 C) (Oral)   Resp 18   Ht 4\' 11"  (1.499 m)   Wt 252 lb (114.3 kg)   SpO2 100%   BMI 50.90 kg/m   Physical Exam  Constitutional: She is oriented to person, place, and time. She appears well-developed and well-nourished. No distress.  HENT:  Head: Normocephalic and atraumatic.  Neck: Normal range of motion. Neck supple.  Pulmonary/Chest: Effort normal.  Musculoskeletal:  There is tenderness to palpation in the soft tissues of the left lateral lumbar region. There is no bony tenderness or step-off.  The right knee appears grossly normal, however exam is limited secondary to obesity. There is no effusion. There is pain with range of motion, however no significant instability with AP or varus or valgus stress.  Neurological: She is alert and oriented to person, place, and time.  DTRs are trace and symmetrical bilaterally. Strength is 5 out of 5 in both lower extremities.  Skin: Skin is warm and dry. She is not diaphoretic.  Nursing note and vitals reviewed.    ED Treatments / Results  Labs (all labs ordered are listed, but only abnormal results are displayed) Labs Reviewed  URINALYSIS, ROUTINE W REFLEX MICROSCOPIC (NOT AT [facility])    EKG  EKG Interpretation None       Radiology No results found.  Procedures Procedures (including critical care time)  Medications Ordered in ED Medications  HYDROcodone-acetaminophen (NORCO/VICODIN) 5-325 MG per tablet 2 tablet (not administered)     Initial Impression / Assessment and Plan / ED Course  I have reviewed the triage vital signs and the nursing notes.  Pertinent labs & imaging results that were available during my care of the patient were reviewed by me and considered in my medical decision making (see chart for  details).  Clinical Course    Patient presents with complaints of low back pain and pain in her right knee. She reports ambulating yesterday for an extended period of time. She has had long-standing problems in her right knee. Nothing today appears emergent. There are no red flags that would suggest an emergent situation with her back. X-rays of the knee and back are both essentially unremarkable. She will be discharged with pain medication. The son is requesting she follow-up with Dr. Merlyn Albert and she will be given the contact information to arrange a follow up appointment.  Final Clinical Impressions(s) / ED Diagnoses   Final diagnoses:  None    New Prescriptions New Prescriptions   No medications on file  Geoffery Lyonsouglas Paizleigh Wilds, MD 02/11/16 1100

## 2016-02-11 NOTE — Discharge Instructions (Signed)
Hydrocodone as prescribed as needed for pain.  Follow-up with Dr. Lequita HaltAluisio in the next 1-2 weeks. His contact information has been provided free to call and make these arrangements.  Return to the emergency department in the meantime if your symptoms significantly worsen or change.

## 2016-02-11 NOTE — ED Notes (Signed)
Patient transported to [facility] 

## 2016-02-11 NOTE — ED Triage Notes (Signed)
Pt reports L lower back pain since yesterday. Pt reports she did a lot of walking yesterday. Pt reports she also has 2 bad knees which cause her pain.

## 2016-02-18 LAB — BASIC METABOLIC PANEL
BUN: 25 mg/dL — AB (ref 4–21)
Creatinine: 2.1 mg/dL — AB (ref 0.5–1.1)
Glucose: 124 mg/dL
Potassium: 3.6 mmol/L (ref 3.4–5.3)
Sodium: 139 mmol/L (ref 137–147)

## 2016-02-18 LAB — HEPATIC FUNCTION PANEL
ALT: 5 U/L — AB (ref 7–35)
AST: 7 U/L — AB (ref 13–35)
Alkaline Phosphatase: 64 U/L (ref 25–125)

## 2016-02-18 LAB — CBC AND DIFFERENTIAL
HEMATOCRIT: 36 % (ref 36–46)
HEMOGLOBIN: 11.2 g/dL — AB (ref 12.0–16.0)
Platelets: 344 10*3/uL (ref 150–399)
WBC: 14.1 10^3/mL

## 2016-02-22 LAB — BASIC METABOLIC PANEL
BUN: 8 mg/dL (ref 4–21)
Creatinine: 1 mg/dL (ref 0.5–1.1)
Potassium: 3.1 mmol/L — AB (ref 3.4–5.3)
Sodium: 142 mmol/L (ref 137–147)

## 2016-02-22 LAB — CBC AND DIFFERENTIAL
HEMATOCRIT: 35 % — AB (ref 36–46)
Hemoglobin: 11.1 g/dL — AB (ref 12.0–16.0)
PLATELETS: 249 10*3/uL (ref 150–399)
WBC: 9 10^3/mL

## 2016-02-23 ENCOUNTER — Non-Acute Institutional Stay (SKILLED_NURSING_FACILITY): Payer: Medicare Other | Admitting: Internal Medicine

## 2016-02-23 ENCOUNTER — Encounter: Payer: Self-pay | Admitting: Internal Medicine

## 2016-02-23 DIAGNOSIS — N179 Acute kidney failure, unspecified: Secondary | ICD-10-CM | POA: Diagnosis not present

## 2016-02-23 DIAGNOSIS — M545 Low back pain, unspecified: Secondary | ICD-10-CM

## 2016-02-23 DIAGNOSIS — K219 Gastro-esophageal reflux disease without esophagitis: Secondary | ICD-10-CM

## 2016-02-23 DIAGNOSIS — I5022 Chronic systolic (congestive) heart failure: Secondary | ICD-10-CM

## 2016-02-23 DIAGNOSIS — R21 Rash and other nonspecific skin eruption: Secondary | ICD-10-CM | POA: Diagnosis not present

## 2016-02-23 DIAGNOSIS — F0280 Dementia in other diseases classified elsewhere without behavioral disturbance: Secondary | ICD-10-CM

## 2016-02-23 DIAGNOSIS — G301 Alzheimer's disease with late onset: Secondary | ICD-10-CM | POA: Diagnosis not present

## 2016-02-23 DIAGNOSIS — J449 Chronic obstructive pulmonary disease, unspecified: Secondary | ICD-10-CM | POA: Diagnosis not present

## 2016-02-23 DIAGNOSIS — R1032 Left lower quadrant pain: Secondary | ICD-10-CM | POA: Diagnosis not present

## 2016-02-23 DIAGNOSIS — E119 Type 2 diabetes mellitus without complications: Secondary | ICD-10-CM

## 2016-02-23 DIAGNOSIS — I951 Orthostatic hypotension: Secondary | ICD-10-CM | POA: Diagnosis not present

## 2016-02-23 DIAGNOSIS — E785 Hyperlipidemia, unspecified: Secondary | ICD-10-CM | POA: Insufficient documentation

## 2016-02-23 DIAGNOSIS — G8929 Other chronic pain: Secondary | ICD-10-CM | POA: Diagnosis not present

## 2016-02-23 NOTE — Progress Notes (Signed)
: Provider:  Noah Delaine. Sheppard Coil, MD Location:  [place] Room Number: [phone] Place of Service:  SNF (31)  PCP: Reeves Dam, MD Patient Care Team: Reeves Dam, MD as PCP - General (Internal Medicine)  Extended Emergency Contact Information Primary Emergency Contact: [place] Phone: [phone] Relation: None     Allergies: Asa [aspirin]; Captopril; Celebrex [celecoxib]; Diphenhydramine; Lasix [furosemide]; Prednisone; Propofol; and Tiotropium bromide [tiotropium]  Chief Complaint  Patient presents with  . New Admit To SNF    Admit to Facility    HPI: Patient is 75 y.o. female with COPD,OSA, chronic systolic CHF obesity hypothyroidism who was admitted to [facility] from 11/3[facility]7 for presyncopy and ARF. Pt had been admitted from 10/28-30 where she was w/u for LBP, L flank pain and LLQ pain with nausea and vomiting with neg w/u except for a UTI which was treated. This hospitalization work-up was negative again except for ARF and orthostatic hypotension 2/2 poor po intake which resolved with IVF. Course was complicated by the finding od shingles and valtrex was started. Pt is admitted to SNF for OT/PT.While at SNF pt will be followed for COPD, tx with advair and prn alb, dementia, tx with namenda and DM, tx with metformin.  Past Medical History:  Diagnosis Date  . Arthritis   . CHF (congestive heart failure) ([place])   . COPD (chronic obstructive pulmonary disease) ([place])   . Diabetes mellitus without complication ([place])   . Emphysema of lung ([place])   . Fibromyalgia   . GERD (gastroesophageal reflux disease)   . Heart valve regurgitation   . High cholesterol   . Hyperlipidemia   . Hypertension   . Hypothyroidism   . Osteoporosis     Past Surgical History:  Procedure Laterality Date  . ABDOMINAL HYSTERECTOMY    . BREAST BIOPSY Left    benign findings  . BREAST SURGERY    . COLONOSCOPY  07/2014  . JOINT REPLACEMENT    . KNEE SURGERY    .  TONSILLECTOMY        Medication List       Accurate as of 02/23/16 11:49 AM. Always use your most recent med list.          acetaminophen 325 MG tablet Commonly known as:  TYLENOL Take 650 mg by mouth every 6 (six) hours as needed.   albuterol 108 (90 Base) MCG/ACT inhaler Commonly known as:  PROVENTIL HFA;VENTOLIN HFA Inhale 2 puffs into the lungs every 4 (four) hours as needed for wheezing.   aluminum-magnesium hydroxide-simethicone 889-169-45 MG/5ML Susp Commonly known as:  MAALOX Take 30 mLs by mouth every 4 (four) hours as needed.   aspirin EC 81 MG tablet Take 81 mg by mouth daily.   atorvastatin 10 MG tablet Commonly known as:  LIPITOR Take 10 mg by mouth daily.   carvedilol 3.125 MG tablet Commonly known as:  COREG Take 3.125 mg by mouth 2 (two) times daily with a meal.   clotrimazole 1 % cream Commonly known as:  LOTRIMIN Apply 1 application topically 2 (two) times daily.   esomeprazole 40 MG capsule Commonly known as:  NEXIUM Take 40 mg by mouth 2 (two) times daily.   ferrous sulfate 325 (65 FE) MG tablet Take 325 mg by mouth daily with breakfast.   fluticasone 50 MCG/ACT nasal spray Commonly known as:  FLONASE Place 1 spray into both nostrils daily as needed for rhinitis.   Fluticasone-Salmeterol 500-50 MCG/DOSE Aepb Commonly  known as:  ADVAIR Inhale 1 puff into the lungs 2 (two) times daily.   furosemide 40 MG tablet Commonly known as:  LASIX Take 40 mg by mouth.   isosorbide dinitrate 20 MG tablet Commonly known as:  ISORDIL Take 20 mg by mouth 2 (two) times daily.   latanoprost 0.005 % ophthalmic solution Commonly known as:  XALATAN Place 1 drop into both eyes at bedtime.   levothyroxine 175 MCG tablet Commonly known as:  SYNTHROID, LEVOTHROID Take 175 mcg by mouth daily before breakfast.   lidocaine 5 % Commonly known as:  LIDODERM Place 1 patch onto the skin daily as needed. Remove & Discard patch within 12 hours or as directed by  MD   loratadine 10 MG tablet Commonly known as:  CLARITIN Take 10 mg by mouth daily as needed for allergies. Every morning prn   losartan 25 MG tablet Commonly known as:  COZAAR Take 25 mg by mouth every evening.   memantine 7 MG Cp24 24 hr capsule Commonly known as:  NAMENDA XR Take 7 mg by mouth daily.   metFORMIN 1000 MG tablet Commonly known as:  GLUCOPHAGE Take 1,000 mg by mouth 2 (two) times daily with a meal.   nystatin cream Commonly known as:  MYCOSTATIN Apply 1 application topically 2 (two) times daily. Until clear then apply as needed   ondansetron 4 MG disintegrating tablet Commonly known as:  ZOFRAN-ODT Take 4 mg by mouth every 8 (eight) hours as needed for nausea. Use up to 7 days   polyethylene glycol packet Commonly known as:  MIRALAX / GLYCOLAX Take 17 g by mouth daily as needed.   potassium chloride 20 MEQ packet Commonly known as:  KLOR-CON Take 20 mEq by mouth daily.   pregabalin 100 MG capsule Commonly known as:  LYRICA Take 100 mg by mouth 2 (two) times daily.   VALTREX 1000 MG tablet Generic drug:  valACYclovir Take 1,000 mg by mouth 3 (three) times daily. For 5 days   Vitamin D3 2000 units Tabs Take 1 tablet by mouth 2 (two) times daily.       Meds ordered this encounter  Medications  . clotrimazole (LOTRIMIN) 1 % cream    Sig: Apply 1 application topically 2 (two) times daily.  . valACYclovir (VALTREX) 1000 MG tablet    Sig: Take 1,000 mg by mouth 3 (three) times daily. For 5 days  . ferrous sulfate 325 (65 FE) MG tablet    Sig: Take 325 mg by mouth daily with breakfast.  . Fluticasone-Salmeterol (ADVAIR) 500-50 MCG/DOSE AEPB    Sig: Inhale 1 puff into the lungs 2 (two) times daily.  Marland Kitchen acetaminophen (TYLENOL) 325 MG tablet    Sig: Take 650 mg by mouth every 6 (six) hours as needed.  Marland Kitchen aluminum-magnesium hydroxide-simethicone (MAALOX) 827-078-67 MG/5ML SUSP    Sig: Take 30 mLs by mouth every 4 (four) hours as needed.  Marland Kitchen aspirin EC  81 MG tablet    Sig: Take 81 mg by mouth daily.  . carvedilol (COREG) 3.125 MG tablet    Sig: Take 3.125 mg by mouth 2 (two) times daily with a meal.  . Cholecalciferol (VITAMIN D3) 2000 units TABS    Sig: Take 1 tablet by mouth 2 (two) times daily.  . fluticasone (FLONASE) 50 MCG/ACT nasal spray    Sig: Place 1 spray into both nostrils daily as needed for rhinitis.  Marland Kitchen latanoprost (XALATAN) 0.005 % ophthalmic solution    Sig: Place 1 drop into  both eyes at bedtime.  . lidocaine (LIDODERM) 5 %    Sig: Place 1 patch onto the skin daily as needed. Remove & Discard patch within 12 hours or as directed by MD  . memantine (NAMENDA XR) 7 MG CP24 24 hr capsule    Sig: Take 7 mg by mouth daily.  . ondansetron (ZOFRAN-ODT) 4 MG disintegrating tablet    Sig: Take 4 mg by mouth every 8 (eight) hours as needed for nausea. Use up to 7 days  . polyethylene glycol (MIRALAX / GLYCOLAX) packet    Sig: Take 17 g by mouth daily as needed.     There is no immunization history on file for this patient.  Social History  Substance Use Topics  . Smoking status: Former Smoker    Years: 25.00    Types: Cigarettes  . Smokeless tobacco: Never Used  . Alcohol use No    Family history is   Family History  Problem Relation Age of Onset  . BRCA 1/2 Sister       Review of Systems  DATA OBTAINED: from patient, nurse GENERAL:  no fevers, fatigue, appetite changes SKIN: No itching, or rash EYES: No eye pain, redness, discharge EARS: No earache, tinnitus, change in hearing NOSE: No congestion, drainage or bleeding  MOUTH/THROAT: No mouth or tooth pain, No sore throat RESPIRATORY: No cough, wheezing, SOB CARDIAC: No chest pain, palpitations, lower extremity edema  GI: No abdominal pain, No N/V/D or constipation, No heartburn or reflux  GU: No dysuria, frequency or urgency, or incontinence  MUSCULOSKELETAL: No unrelieved bone/joint pain NEUROLOGIC: No headache, dizziness or focal weakness PSYCHIATRIC:  No c/o anxiety or sadness   Vitals:   02/23/16 1107  BP: (!) 142/77  Pulse: 96  Resp: 18  Temp: 97.5 F (36.4 C)    SpO2 Readings from Last 1 Encounters:  02/23/16 97%   Body mass index is 50.09 kg/m.     Physical Exam  GENERAL APPEARANCE: Alert, conversant,  No acute distress.  SKIN: non specific rash, does not look herpetic HEAD: Normocephalic, atraumatic  EYES: Conjunctiva/lids clear. Pupils round, reactive. EOMs intact.  EARS: External exam WNL, canals clear. Hearing grossly normal.  NOSE: No deformity or discharge.  MOUTH/THROAT: Lips w/o lesions  RESPIRATORY: Breathing is even, unlabored. Lung sounds are clear   CARDIOVASCULAR: Heart RRR no murmurs, rubs or gallops. No peripheral edema.   GASTROINTESTINAL: Abdomen is soft, non-tender, not distended w/ normal bowel sounds. GENITOURINARY: Bladder non tender, not distended  MUSCULOSKELETAL: No abnormal joints or musculature NEUROLOGIC:  Cranial nerves 2-12 grossly intact. Moves all extremities  PSYCHIATRIC: Mood and affect appropriate to situation, no behavioral issues  Patient Active Problem List   Diagnosis Date Noted  . Hyperlipidemia       Labs reviewed: Basic Metabolic Panel:    Component Value Date/Time   NA 142 02/22/2016   K 3.1 (A) 02/22/2016   CL 105 10/21/2014 1045   CO2 27 10/21/2014 1045   GLUCOSE 130 (H) 10/21/2014 1045   BUN 8 02/22/2016   CREATININE 1.0 02/22/2016   CREATININE 0.83 10/21/2014 1045   CALCIUM 9.7 10/21/2014 1045   PROT 7.7 07/05/2013 1850   ALBUMIN 3.4 (L) 07/05/2013 1850   AST 7 (A) 02/18/2016   ALT 5 (A) 02/18/2016   ALKPHOS 64 02/18/2016   BILITOT <0.2 (L) 07/05/2013 1850   GFRNONAA >60 10/21/2014 1045   GFRAA >60 10/21/2014 1045     Recent Labs  02/18/16 02/22/16  NA 139 142  K 3.6 3.1*  BUN 25* 8  CREATININE 2.1* 1.0   Liver Function Tests:  Recent Labs  02/18/16  AST 7*  ALT 5*  ALKPHOS 64   No results for input(s): LIPASE, AMYLASE in the last  8760 hours. No results for input(s): AMMONIA in the last 8760 hours. CBC:  Recent Labs  02/18/16 02/22/16  WBC 14.1 9.0  HGB 11.2* 11.1*  HCT 36 35*  PLT 344 249   Lipid No results for input(s): CHOL, HDL, LDLCALC, TRIG in the last 8760 hours.  Cardiac Enzymes: No results for input(s): CKTOTAL, CKMB, CKMBINDEX, TROPONINI in the last 8760 hours. BNP: No results for input(s): BNP in the last 8760 hours. No results found for: MICROALBUR No results found for: HGBA1C No results found for: TSH No results found for: VITAMINB12 No results found for: FOLATE No results found for: IRON, TIBC, FERRITIN  Imaging and Procedures obtained prior to SNF admission: Dg Lumbar Spine Complete  Result Date: 02/11/2016 CLINICAL DATA:  Low back pain since yesterday after doing a lot of walking. EXAM: LUMBAR SPINE - COMPLETE 4+ VIEW COMPARISON:  CT 07/05/2013 FINDINGS: Vertebral body alignment and heights are normal. Disc space heights are maintained. There is mild spondylosis of the lumbar spine to include facet arthropathy. No compression fracture no significant spondylolisthesis or spondylolysis. Mild calcified plaque over the distal abdominal aorta and iliac arteries. Spondylosis of the visualized lower thoracic spine. IMPRESSION: No acute findings. Minimal spondylosis of the lumbar spine to include moderate facet arthropathy. Aortic atherosclerosis. Electronically Signed   By: Marin Olp M.D.   On: 02/11/2016 10:49   Dg Knee Complete 4 Views Right  Result Date: 02/11/2016 CLINICAL DATA:  Right knee pain and history of previous right knee surgery. EXAM: RIGHT KNEE - COMPLETE 4+ VIEW COMPARISON:  None. FINDINGS: There is moderate tricompartmental osteoarthritis worse over the medial compartment and patellofemoral joints. No significant joint effusion. Lateral bone stable over the proximal tibia intact. Two adjacent loose bodies over the inferior aspect of the medial popliteal fossa with the larger  measuring 1.4 cm. IMPRESSION: No acute findings. Moderate tricompartmental osteoarthritis. Two adjacent loose bodies over the inferior aspect of the popliteal fossa with the larger measuring 1.4 cm. Hardware over the proximal tibia intact. Electronically Signed   By: Marin Olp M.D.   On: 02/11/2016 10:46     Not all labs, radiology exams or other studies done during hospitalization come through on my EPIC note; however they are reviewed by me.    Assessment and Plan  AKI/ORTHOSTATIC HYPOTENSION - resolved with IVF SNF - d/c Cr 0.95; will f/u BMP in 5 days  ABDOMINAL PAIN/ BACK PAIN/LHIP/LLQ PAIN/GERD -EGD and colonoscopy done normal except colonoscopy showed colon polyps s/p colectomy; MRI L spine and L hip shows NAD no significant arthritis-pain probably musculoskeletal SNF - resume mexium 40 mg BID  CHRONIC SYSTOLIC CHF - pt changed to lower dose lasix with K+ supplement SNF - cont lasix 40 mg daily with K+ 20 meq daily  DM2 SNf - restart glucophage 1000 mg BID  RASH SNF - does not look like shingles; d/c clotrimazole and valtrex  COPD SNF - stable; cont Advair diskus500-50 1 puff BID  DEMENTIA SNF - stable ;cont namenda 28 1 po daily   Time spent > 45 min;> 50% of time with patient was spent reviewing records, labs, tests and studies, counseling and developing plan of care  Webb Silversmith D. Sheppard Coil, MD

## 2016-02-24 ENCOUNTER — Encounter: Payer: Self-pay | Admitting: Internal Medicine

## 2016-02-24 DIAGNOSIS — J449 Chronic obstructive pulmonary disease, unspecified: Secondary | ICD-10-CM | POA: Insufficient documentation

## 2016-02-24 DIAGNOSIS — I951 Orthostatic hypotension: Secondary | ICD-10-CM | POA: Insufficient documentation

## 2016-02-24 DIAGNOSIS — M545 Low back pain, unspecified: Secondary | ICD-10-CM | POA: Insufficient documentation

## 2016-02-24 DIAGNOSIS — F0390 Unspecified dementia without behavioral disturbance: Secondary | ICD-10-CM | POA: Insufficient documentation

## 2016-02-24 DIAGNOSIS — R109 Unspecified abdominal pain: Secondary | ICD-10-CM | POA: Insufficient documentation

## 2016-02-24 DIAGNOSIS — I509 Heart failure, unspecified: Secondary | ICD-10-CM | POA: Insufficient documentation

## 2016-02-24 DIAGNOSIS — K219 Gastro-esophageal reflux disease without esophagitis: Secondary | ICD-10-CM | POA: Insufficient documentation

## 2016-02-24 DIAGNOSIS — R21 Rash and other nonspecific skin eruption: Secondary | ICD-10-CM | POA: Insufficient documentation

## 2016-02-24 DIAGNOSIS — N179 Acute kidney failure, unspecified: Secondary | ICD-10-CM | POA: Insufficient documentation

## 2016-02-24 DIAGNOSIS — E119 Type 2 diabetes mellitus without complications: Secondary | ICD-10-CM | POA: Insufficient documentation

## 2016-03-08 ENCOUNTER — Non-Acute Institutional Stay (SKILLED_NURSING_FACILITY): Payer: Medicare Other | Admitting: Internal Medicine

## 2016-03-08 DIAGNOSIS — R21 Rash and other nonspecific skin eruption: Secondary | ICD-10-CM

## 2016-03-08 DIAGNOSIS — F0280 Dementia in other diseases classified elsewhere without behavioral disturbance: Secondary | ICD-10-CM

## 2016-03-08 DIAGNOSIS — M545 Low back pain: Secondary | ICD-10-CM | POA: Diagnosis not present

## 2016-03-08 DIAGNOSIS — E119 Type 2 diabetes mellitus without complications: Secondary | ICD-10-CM | POA: Diagnosis not present

## 2016-03-08 DIAGNOSIS — G8929 Other chronic pain: Secondary | ICD-10-CM

## 2016-03-08 DIAGNOSIS — R1032 Left lower quadrant pain: Secondary | ICD-10-CM

## 2016-03-08 DIAGNOSIS — I951 Orthostatic hypotension: Secondary | ICD-10-CM | POA: Diagnosis not present

## 2016-03-08 DIAGNOSIS — K219 Gastro-esophageal reflux disease without esophagitis: Secondary | ICD-10-CM | POA: Diagnosis not present

## 2016-03-08 DIAGNOSIS — N179 Acute kidney failure, unspecified: Secondary | ICD-10-CM

## 2016-03-08 DIAGNOSIS — G301 Alzheimer's disease with late onset: Secondary | ICD-10-CM

## 2016-03-08 DIAGNOSIS — I5022 Chronic systolic (congestive) heart failure: Secondary | ICD-10-CM

## 2016-03-08 DIAGNOSIS — J449 Chronic obstructive pulmonary disease, unspecified: Secondary | ICD-10-CM | POA: Diagnosis not present

## 2016-03-13 ENCOUNTER — Encounter: Payer: Self-pay | Admitting: Internal Medicine

## 2016-03-13 NOTE — Progress Notes (Signed)
[facility] Room Number: [phone] Place of Service:  SNF [phone]  Randon Goldsmithnne D. Lyn HollingsheadAlexander, MD  PCP: Agustina CaroliHICKS, KRISTIN D, MD Patient Care Team: Agustina CaroliKristin D Hicks, MD as PCP - General (Internal Medicine)  Extended Emergency Contact Information Primary Emergency Contact: Rogers SeedsRAY,MARY Carleena Mires Home Phone: [phone] Relation: None  Allergies  Allergen Reactions  . Asa [Aspirin]   . Captopril   . Celebrex [Celecoxib]   . Diphenhydramine   . Lasix [Furosemide]   . Prednisone   . Propofol Swelling  . Tiotropium Bromide [Tiotropium]     Chief Complaint  Patient presents with  . Discharge Note    Discharged from SNF    HPI:  75 y.o. female  with COPD,OSA, chronic systolic CHF obesity hypothyroidism who [facility] for presyncopy and ARF. Pt had been admitted from 10/28-30 where she was w/u for LBP, L flank pain and LLQ pain with nausea and vomiting with neg w/u except for a UTI which was treated. This hospitalization work-up was negative again except for ARF and orthostatic hypotension 2/2 poor po intake which resolved with IVF. Course was complicated by the finding od shingles and valtrex was started. Pt was admitted to SNF for OT/PT and is now ready to be d/c to home.    Past Medical History:  Diagnosis Date  . Arthritis   . CHF (congestive heart failure) (HCC)   . COPD (chronic obstructive pulmonary disease) (HCC)   . Diabetes mellitus without complication (HCC)   . Emphysema of lung (HCC)   . Fibromyalgia   . GERD (gastroesophageal reflux disease)   . Heart valve regurgitation   . High cholesterol   . Hyperlipidemia   . Hypertension   . Hypothyroidism   . Osteoporosis     Past Surgical History:  Procedure Laterality Date  . ABDOMINAL HYSTERECTOMY    . BREAST BIOPSY Left    benign findings  . BREAST SURGERY    . COLONOSCOPY  07/2014  . JOINT REPLACEMENT    . KNEE SURGERY    . TONSILLECTOMY       reports that she has quit  smoking. Her smoking use included Cigarettes. She quit after 25.00 years of use. She has never used smokeless tobacco. She reports that she does not drink alcohol or use drugs. Social History   Social History  . Marital status: Widowed    Spouse name: N/A  . Number of children: N/A  . Years of education: N/A   Occupational History  . Not on file.   Social History Main Topics  . Smoking status: Former Smoker    Years: 25.00    Types: Cigarettes  . Smokeless tobacco: Never Used  . Alcohol use No  . Drug use: No  . Sexual activity: Not on file   Other Topics Concern  . Not on file   Social History Narrative  . No narrative on file    Pertinent  Health Maintenance Due  Topic Date Due  . HEMOGLOBIN A1C  11/10/40  . FOOT EXAM  11/19/1950  . OPHTHALMOLOGY EXAM  11/19/1950  . COLONOSCOPY  11/19/1990  . DEXA SCAN  11/18/2005  . PNA vac Low Risk Adult (1 of 2 - PCV13) 11/18/2005  . INFLUENZA VACCINE  11/16/2015    Medications:   Medication List       Accurate as of 03/08/16 11:59 PM. Always use your most recent med list.  acetaminophen 325 MG tablet Commonly known as:  TYLENOL Take 650 mg by mouth every 6 (six) hours as needed.   albuterol 108 (90 Base) MCG/ACT inhaler Commonly known as:  PROVENTIL HFA;VENTOLIN HFA Inhale 2 puffs into the lungs every 4 (four) hours as needed for wheezing.   aluminum-magnesium hydroxide-simethicone 200-200-20 MG/5ML Susp Commonly known as:  MAALOX Take 30 mLs by mouth every 4 (four) hours as needed.   aspirin EC 81 MG tablet Take 81 mg by mouth daily.   atorvastatin 10 MG tablet Commonly known as:  LIPITOR Take 10 mg by mouth daily.   carvedilol 3.125 MG tablet Commonly known as:  COREG Take 3.125 mg by mouth 2 (two) times daily with a meal.   esomeprazole 40 MG capsule Commonly known as:  NEXIUM Take 40 mg by mouth 2 (two) times daily.   ferrous sulfate 325 (65 FE) MG tablet Take 325 mg by mouth daily with  breakfast.   fluticasone 50 MCG/ACT nasal spray Commonly known as:  FLONASE Place 1 spray into both nostrils daily as needed for rhinitis.   Fluticasone-Salmeterol 500-50 MCG/DOSE Aepb Commonly known as:  ADVAIR Inhale 1 puff into the lungs 2 (two) times daily.   furosemide 40 MG tablet Commonly known as:  LASIX Take 40 mg by mouth.   isosorbide dinitrate 20 MG tablet Commonly known as:  ISORDIL Take 20 mg by mouth 2 (two) times daily.   latanoprost 0.005 % ophthalmic solution Commonly known as:  XALATAN Place 1 drop into both eyes at bedtime.   levothyroxine 175 MCG tablet Commonly known as:  SYNTHROID, LEVOTHROID Take 175 mcg by mouth daily before breakfast.   lidocaine 5 % Commonly known as:  LIDODERM Place 1 patch onto the skin daily as needed. Remove & Discard patch within 12 hours or as directed by MD   loratadine 10 MG tablet Commonly known as:  CLARITIN Take 10 mg by mouth daily as needed for allergies. Every morning prn   losartan 25 MG tablet Commonly known as:  COZAAR Take 25 mg by mouth every evening.   meclizine 25 MG tablet Commonly known as:  ANTIVERT Take 25 mg by mouth 3 (three) times daily.   memantine 7 MG Cp24 24 hr capsule Commonly known as:  NAMENDA XR Take 7 mg by mouth daily.   metFORMIN 1000 MG tablet Commonly known as:  GLUCOPHAGE Take 1,000 mg by mouth 2 (two) times daily with a meal.   nystatin cream Commonly known as:  MYCOSTATIN Apply 1 application topically 2 (two) times daily as needed for dry skin.   polyethylene glycol packet Commonly known as:  MIRALAX / GLYCOLAX Take 17 g by mouth daily as needed.   potassium chloride 20 MEQ packet Commonly known as:  KLOR-CON Take 20 mEq by mouth daily.   pregabalin 100 MG capsule Commonly known as:  LYRICA Take 100 mg by mouth 2 (two) times daily.   venlafaxine 37.5 MG tablet Commonly known as:  EFFEXOR Take 37.5 mg by mouth daily.   Vitamin D3 2000 units Tabs Take 1 tablet  by mouth 2 (two) times daily.        Vitals:   03/08/16 1016  BP: (!) 142/75  Pulse: 99  Resp: 18  Temp: 97.1 F (36.2 C)  Weight: 248 lb (112.5 kg)  Height: 4\' 11"  (1.499 m)   Body mass index is 50.09 kg/m.  Physical Exam  GENERAL APPEARANCE: Alert, conversant. No acute distress.  HEENT: Unremarkable. RESPIRATORY: Breathing is  even, unlabored. Lung sounds are clear   CARDIOVASCULAR: Heart RRR no murmurs, rubs or gallops. No peripheral edema.  GASTROINTESTINAL: Abdomen is soft, non-tender, not distended w/ normal bowel sounds.  NEUROLOGIC: Cranial nerves 2-12 grossly intact. Moves all extremities   Labs reviewed: Basic Metabolic Panel:  Recent Labs  16/10/96 02/22/16  NA 139 142  K 3.6 3.1*  BUN 25* 8  CREATININE 2.1* 1.0   No results found for: [facility] Liver Function Tests:  Recent Labs  02/18/16  AST 7*  ALT 5*  ALKPHOS 64   No results for input(s): LIPASE, AMYLASE in the last 8760 hours. No results for input(s): AMMONIA in the last 8760 hours. CBC:  Recent Labs  02/18/16 02/22/16  WBC 14.1 9.0  HGB 11.2* 11.1*  HCT 36 35*  PLT 344 249   Lipid No results for input(s): CHOL, HDL, LDLCALC, TRIG in the last 8760 hours. Cardiac Enzymes: No results for input(s): CKTOTAL, CKMB, CKMBINDEX, TROPONINI in the last 8760 hours. BNP: No results for input(s): BNP in the last 8760 hours. CBG: No results for input(s): GLUCAP in the last 8760 hours.  Procedures and Imaging Studies During Stay: No results found.  Assessment/Plan:   AKI (acute kidney injury) (HCC)  Left lower quadrant pain  Chronic low back pain without sciatica, unspecified back pain laterality  Orthostatic hypotension  Gastroesophageal reflux disease without esophagitis  Diabetes mellitus without complication (HCC)  Late onset Alzheimer's disease without behavioral disturbance  Rash and nonspecific skin eruption  Chronic obstructive pulmonary disease, unspecified COPD type  (HCC)  Chronic systolic congestive heart failure (HCC)   Patient is being discharged with the following home health services:  HH/OT/PT/Nursing  Patient is being discharged with the following durable medical equipment:  Rolling walker, transport WC  Patient has been advised to f/u with their PCP in 1-2 weeks to bring them up to date on their rehab stay.  Social services at facility was responsible for arranging this appointment.  Pt was provided with a 30 day supply of prescriptions for medications and refills must be obtained from their PCP.  For controlled substances, a more limited supply may be provided adequate until PCP appointment only.   Time spent > 30 min;> 50% of time with patient was spent reviewing records, labs, tests and studies, counseling and developing plan of care  Randon Goldsmith. Lyn Hollingshead, MD

## 2016-05-09 ENCOUNTER — Ambulatory Visit: Payer: Self-pay | Admitting: Orthopedic Surgery

## 2016-05-12 NOTE — Progress Notes (Signed)
EKG 01-05-16 DR CHIU ON CHART CARDIAC CLEARANCE NOTE/LOV 04-19-16 DR CHIU ON CHART  STRESS TEST 12-07-14 DR CHIU ON CHART

## 2016-05-12 NOTE — Patient Instructions (Addendum)
Emily Cross  05/12/2016   Your procedure is scheduled on: 05-24-16  Report to [facility]  Entrance take [facility]  elevators to 3rd floor to  Short Stay Center at 1245 PM  Call this number if you have problems the morning of surgery [phone]   Remember: ONLY 1 PERSON MAY GO WITH YOU TO SHORT STAY TO GET  READY MORNING OF YOUR SURGERY.  Do not eat food  :After Midnight, MAY HAVE CLEAR LIQUIDS FROM MIDNIGHT UNTIL 945 AM DAY OF SURGERY - THEN NOTHING BY MOUTH AGTER 945 AM DAY OF SURGERY.   BRING CPAP MASK AND TUBING   Take these medicines the morning of surgery with A SIP OF WATER: ALBUTEROL INHALER IF NEEDED AND BRING INHALER, ATORVASTATIN (LIPITOR), CARVEDILOL (COREG), ESOMEPRAZOLE (NEXIUM), FLONASE NASAL SPRAY IF NEEDED, ADVAIR IF NEEDED, ISOSORBIDE DINITRATE (ISORDIL), LEVOTHYROXINE (SYNTHROID), LYRICA, VENLAFAXINE (EFFEXOR), NAMENDA  DO NOT TAKE ANY DIABETIC MEDICATIONS DAY OF YOUR SURGERY                               You may not have any metal on your body including hair pins and              piercings  Do not wear jewelry, make-up, lotions, powders or perfumes, deodorant             Do not wear nail polish.  Do not shave  48 hours prior to surgery.              Do not bring valuables to the hospital. [facility] IS NOT             RESPONSIBLE   FOR VALUABLES.  Contacts, dentures or bridgework may not be worn into surgery.  Leave suitcase in the car. After surgery it may be brought to your room.                Please read over the following fact sheets you were given: _____________________________________________________________________                CLEAR LIQUID DIET   Foods Allowed                                                                     Foods Excluded  Coffee and tea, regular and decaf                             liquids that you cannot  Plain Jell-O in any flavor                                             see through such  as: Fruit ices (not with fruit pulp)                                     milk, soups, orange juice  Iced  Popsicles                                    All solid food Carbonated beverages, regular and diet                                    Cranberry, grape and apple juices Sports drinks like Gatorade Lightly seasoned clear broth or consume(fat free) Sugar, honey syrup  Sample Menu Breakfast                                Lunch                                     Supper Cranberry juice                    Beef broth                            Chicken broth Jell-O                                     Grape juice                           Apple juice Coffee or tea                        Jell-O                                      Popsicle                                                Coffee or tea                        Coffee or tea  _____________________________________________________________________  How to Manage Your Diabetes Before and After Surgery  Why is it important to control my blood sugar before and after surgery? . Improving blood sugar levels before and after surgery helps healing and can limit problems. . A way of improving blood sugar control is eating a healthy diet by: o  Eating less sugar and carbohydrates o  Increasing activity/exercise o  Talking with your doctor about reaching your blood sugar goals . High blood sugars (greater than 180 mg/dL) can raise your risk of infections and slow your recovery, so you will need to focus on controlling your diabetes during the weeks before surgery. . Make sure that the doctor who takes care of your diabetes knows about your planned surgery including the date and location.  How do I manage my blood sugar before surgery? . Check your blood sugar at least 4 times a day, starting 2 days before surgery, to make sure that the level is not too high  or low. o Check your blood sugar the morning of your surgery when you wake up and every  2 hours until you get to the Short Stay unit. . If your blood sugar is less than 70 mg/dL, you will need to treat for low blood sugar: o Do not take insulin. o Treat a low blood sugar (less than 70 mg/dL) with  cup of clear juice (cranberry or apple), 4 glucose tablets, OR glucose gel. o Recheck blood sugar in 15 minutes after treatment (to make sure it is greater than 70 mg/dL). If your blood sugar is not greater than 70 mg/dL on recheck, call [phone] for further instructions. . Report your blood sugar to the short stay nurse when you get to Short Stay.  . If you are admitted to the hospital after surgery: o Your blood sugar will be checked by the staff and you will probably be given insulin after surgery (instead of oral diabetes medicines) to make sure you have good blood sugar levels. o The goal for blood sugar control after surgery is 80-180 mg/dL.   WHAT DO I DO ABOUT MY DIABETES MEDICATION?  YOU MAY TAKE YOUR METFORMIN  THE DAY BEFORE SURGERY ON 05-24-15 . Do not take YOUR METFORMIN the morning of surgery.   .    Patient Signature:  Date:   Nurse Signature:  Date:   Reviewed and Endorsed by [facility] Patient Education Committee, August 2015[facility] - Preparing for Surgery Before surgery, you can play an important role.  Because skin is not sterile, your skin needs to be as free of germs as possible.  You can reduce the number of germs on your skin by washing with CHG (chlorahexidine gluconate) soap before surgery.  CHG is an antiseptic cleaner which kills germs and bonds with the skin to continue killing germs even after washing. Please DO NOT use if you have an allergy to CHG or antibacterial soaps.  If your skin becomes reddened/irritated stop using the CHG and inform your nurse when you arrive at Short Stay. Do not shave (including legs and underarms) for at least 48 hours prior to the first CHG shower.  You may shave your face/neck. Please follow these instructions  carefully:  1.  Shower with CHG Soap the night before surgery and the  morning of Surgery.  2.  If you choose to wash your hair, wash your hair first as usual with your  normal  shampoo.  3.  After you shampoo, rinse your hair and body thoroughly to remove the  shampoo.                           4.  Use CHG as you would any other liquid soap.  You can apply chg directly  to the skin and wash                       Gently with a scrungie or clean washcloth.  5.  Apply the CHG Soap to your body ONLY FROM THE NECK DOWN.   Do not use on face/ open                           Wound or open sores. Avoid contact with eyes, ears mouth and genitals (private parts).  Wash face,  Genitals (private parts) with your normal soap.             6.  Wash thoroughly, paying special attention to the area where your surgery  will be performed.  7.  Thoroughly rinse your body with warm water from the neck down.  8.  DO NOT shower/wash with your normal soap after using and rinsing off  the CHG Soap.                9.  Pat yourself dry with a clean towel.            10.  Wear clean pajamas.            11.  Place clean sheets on your bed the night of your first shower and do not  sleep with pets. Day of Surgery : Do not apply any lotions/deodorants the morning of surgery.  Please wear clean clothes to the hospital/surgery center.    Incentive Spirometer  An incentive spirometer is a tool that can help keep your lungs clear and active. This tool measures how well you are filling your lungs with each breath. Taking long deep breaths may help reverse or decrease the chance of developing breathing (pulmonary) problems (especially infection) following:  A long period of time when you are unable to move or be active. BEFORE THE PROCEDURE   If the spirometer includes an indicator to show your best effort, your nurse or respiratory therapist will set it to a desired goal.  If possible, sit up straight  or lean slightly forward. Try not to slouch.  Hold the incentive spirometer in an upright position. INSTRUCTIONS FOR USE  1. Sit on the edge of your bed if possible, or sit up as far as you can in bed or on a chair. 2. Hold the incentive spirometer in an upright position. 3. Breathe out normally. 4. Place the mouthpiece in your mouth and seal your lips tightly around it. 5. Breathe in slowly and as deeply as possible, raising the piston or the ball toward the top of the column. 6. Hold your breath for 3-5 seconds or for as long as possible. Allow the piston or ball to fall to the bottom of the column. 7. Remove the mouthpiece from your mouth and breathe out normally. 8. Rest for a few seconds and repeat Steps 1 through 7 at least 10 times every 1-2 hours when you are awake. Take your time and take a few normal breaths between deep breaths. 9. The spirometer may include an indicator to show your best effort. Use the indicator as a goal to work toward during each repetition. 10. After each set of 10 deep breaths, practice coughing to be sure your lungs are clear. If you have an incision (the cut made at the time of surgery), support your incision when coughing by placing a pillow or rolled up towels firmly against it. Once you are able to get out of bed, walk around indoors and cough well. You may stop using the incentive spirometer when instructed by your caregiver.  RISKS AND COMPLICATIONS  Take your time so you do not get dizzy or light-headed.  If you are in pain, you may need to take or ask for pain medication before doing incentive spirometry. It is harder to take a deep breath if you are having pain. AFTER USE  Rest and breathe slowly and easily.  It can be helpful to keep track of a log  of your progress. Your caregiver can provide you with a simple table to help with this. If you are using the spirometer at home, follow these instructions: SEEK MEDICAL CARE IF:   You are having  difficultly using the spirometer.  You have trouble using the spirometer as often as instructed.  Your pain medication is not giving enough relief while using the spirometer.  You develop fever of 100.5 F (38.1 C) or higher. SEEK IMMEDIATE MEDICAL CARE IF:   You cough up bloody sputum that had not been present before.  You develop fever of 102 F (38.9 C) or greater.  You develop worsening pain at or near the incision site. MAKE SURE YOU:   Understand these instructions.  Will watch your condition.  Will get help right away if you are not doing well or get worse. Document Released: 08/14/2006 Document Revised: 06/26/2011 Document Reviewed: 10/15/2006 [facility] Patient Information 2014 [place], [place].   ________________________________________________________________________

## 2016-05-16 ENCOUNTER — Encounter (HOSPITAL_COMMUNITY)
Admission: RE | Admit: 2016-05-16 | Discharge: 2016-05-16 | Disposition: A | Discharge status: Home / Self Care | Payer: Medicare Other | Source: Ambulatory Visit | Attending: Orthopedic Surgery | Admitting: Orthopedic Surgery

## 2016-05-16 ENCOUNTER — Encounter (INDEPENDENT_AMBULATORY_CARE_PROVIDER_SITE_OTHER): Payer: Self-pay

## 2016-05-16 ENCOUNTER — Encounter (HOSPITAL_COMMUNITY): Payer: Self-pay

## 2016-05-16 DIAGNOSIS — M25561 Pain in right knee: Secondary | ICD-10-CM | POA: Diagnosis not present

## 2016-05-16 DIAGNOSIS — Z01812 Encounter for preprocedural laboratory examination: Secondary | ICD-10-CM | POA: Insufficient documentation

## 2016-05-16 HISTORY — DX: Sleep apnea, unspecified: G47.30

## 2016-05-16 HISTORY — DX: Headache: R51

## 2016-05-16 HISTORY — DX: Headache, unspecified: R51.9

## 2016-05-16 HISTORY — DX: Dyspnea, unspecified: R06.00

## 2016-05-16 HISTORY — DX: Depression, unspecified: F32.A

## 2016-05-16 HISTORY — DX: Pneumonia, unspecified organism: J18.9

## 2016-05-16 HISTORY — DX: Tinnitus, unspecified ear: H93.19

## 2016-05-16 HISTORY — DX: Unspecified dementia, unspecified severity, without behavioral disturbance, psychotic disturbance, mood disturbance, and anxiety: F03.90

## 2016-05-16 HISTORY — DX: Major depressive disorder, single episode, unspecified: F32.9

## 2016-05-16 HISTORY — DX: Anemia, unspecified: D64.9

## 2016-05-16 HISTORY — DX: Anxiety disorder, unspecified: F41.9

## 2016-05-16 HISTORY — DX: Vitamin D deficiency, unspecified: E55.9

## 2016-05-16 LAB — CBC
HCT: 32.2 % — ABNORMAL LOW (ref 36.0–46.0)
Hemoglobin: 10.4 g/dL — ABNORMAL LOW (ref 12.0–15.0)
MCH: 24.4 pg — AB (ref 26.0–34.0)
MCHC: 32.3 g/dL (ref 30.0–36.0)
MCV: 75.4 fL — AB (ref 78.0–100.0)
PLATELETS: 267 10*3/uL (ref 150–400)
RBC: 4.27 MIL/uL (ref 3.87–5.11)
RDW: 18.5 % — AB (ref 11.5–15.5)
WBC: 9.1 10*3/uL (ref 4.0–10.5)

## 2016-05-16 LAB — BASIC METABOLIC PANEL
Anion gap: 9 (ref 5–15)
BUN: 13 mg/dL (ref 6–20)
CALCIUM: 9.4 mg/dL (ref 8.9–10.3)
CO2: 27 mmol/L (ref 22–32)
CREATININE: 0.94 mg/dL (ref 0.44–1.00)
Chloride: 106 mmol/L (ref 101–111)
GFR calc non Af Amer: 58 mL/min — ABNORMAL LOW (ref >60)
Glucose, Bld: 107 mg/dL — ABNORMAL HIGH (ref 65–99)
Potassium: 3.8 mmol/L (ref 3.5–5.1)
SODIUM: 142 mmol/L (ref 135–145)

## 2016-05-16 LAB — GLUCOSE, CAPILLARY: Glucose-Capillary: 121 mg/dL — ABNORMAL HIGH (ref 65–99)

## 2016-05-17 LAB — HEMOGLOBIN A1C
HEMOGLOBIN A1C: 6 % — AB (ref 4.8–5.6)
Mean Plasma Glucose: 126 mg/dL

## 2016-05-24 ENCOUNTER — Ambulatory Visit (HOSPITAL_COMMUNITY): Payer: Medicare Other | Admitting: Anesthesiology

## 2016-05-24 ENCOUNTER — Observation Stay (HOSPITAL_COMMUNITY)
Admission: RE | Admit: 2016-05-24 | Discharge: 2016-05-26 | Disposition: A | Discharge status: Skilled Nursing Facility | Payer: Medicare Other | Source: Ambulatory Visit | Attending: Orthopedic Surgery | Admitting: Orthopedic Surgery

## 2016-05-24 ENCOUNTER — Encounter (HOSPITAL_COMMUNITY): Payer: Self-pay | Admitting: *Deleted

## 2016-05-24 ENCOUNTER — Encounter (HOSPITAL_COMMUNITY)
Admission: RE | Disposition: A | Discharge status: Skilled Nursing Facility | Payer: Self-pay | Source: Ambulatory Visit | Attending: Orthopedic Surgery

## 2016-05-24 DIAGNOSIS — Z7984 Long term (current) use of oral hypoglycemic drugs: Secondary | ICD-10-CM | POA: Insufficient documentation

## 2016-05-24 DIAGNOSIS — E039 Hypothyroidism, unspecified: Secondary | ICD-10-CM | POA: Diagnosis not present

## 2016-05-24 DIAGNOSIS — Z969 Presence of functional implant, unspecified: Secondary | ICD-10-CM

## 2016-05-24 DIAGNOSIS — I509 Heart failure, unspecified: Secondary | ICD-10-CM | POA: Insufficient documentation

## 2016-05-24 DIAGNOSIS — M179 Osteoarthritis of knee, unspecified: Secondary | ICD-10-CM

## 2016-05-24 DIAGNOSIS — F0390 Unspecified dementia without behavioral disturbance: Secondary | ICD-10-CM | POA: Insufficient documentation

## 2016-05-24 DIAGNOSIS — E559 Vitamin D deficiency, unspecified: Secondary | ICD-10-CM | POA: Insufficient documentation

## 2016-05-24 DIAGNOSIS — E119 Type 2 diabetes mellitus without complications: Secondary | ICD-10-CM | POA: Diagnosis not present

## 2016-05-24 DIAGNOSIS — Z888 Allergy status to other drugs, medicaments and biological substances status: Secondary | ICD-10-CM | POA: Diagnosis not present

## 2016-05-24 DIAGNOSIS — E7800 Pure hypercholesterolemia, unspecified: Secondary | ICD-10-CM | POA: Diagnosis not present

## 2016-05-24 DIAGNOSIS — Z87891 Personal history of nicotine dependence: Secondary | ICD-10-CM | POA: Diagnosis not present

## 2016-05-24 DIAGNOSIS — I110 Hypertensive heart disease with heart failure: Secondary | ICD-10-CM | POA: Diagnosis not present

## 2016-05-24 DIAGNOSIS — M810 Age-related osteoporosis without current pathological fracture: Secondary | ICD-10-CM | POA: Diagnosis not present

## 2016-05-24 DIAGNOSIS — F419 Anxiety disorder, unspecified: Secondary | ICD-10-CM | POA: Insufficient documentation

## 2016-05-24 DIAGNOSIS — J439 Emphysema, unspecified: Secondary | ICD-10-CM | POA: Insufficient documentation

## 2016-05-24 DIAGNOSIS — Z886 Allergy status to analgesic agent status: Secondary | ICD-10-CM | POA: Diagnosis not present

## 2016-05-24 DIAGNOSIS — M1711 Unilateral primary osteoarthritis, right knee: Secondary | ICD-10-CM | POA: Diagnosis not present

## 2016-05-24 DIAGNOSIS — F329 Major depressive disorder, single episode, unspecified: Secondary | ICD-10-CM | POA: Diagnosis not present

## 2016-05-24 DIAGNOSIS — Z472 Encounter for removal of internal fixation device: Secondary | ICD-10-CM | POA: Diagnosis not present

## 2016-05-24 DIAGNOSIS — Z6841 Body Mass Index (BMI) 40.0 and over, adult: Secondary | ICD-10-CM | POA: Insufficient documentation

## 2016-05-24 DIAGNOSIS — Z7982 Long term (current) use of aspirin: Secondary | ICD-10-CM | POA: Insufficient documentation

## 2016-05-24 DIAGNOSIS — M797 Fibromyalgia: Secondary | ICD-10-CM | POA: Insufficient documentation

## 2016-05-24 DIAGNOSIS — G4730 Sleep apnea, unspecified: Secondary | ICD-10-CM | POA: Insufficient documentation

## 2016-05-24 DIAGNOSIS — K219 Gastro-esophageal reflux disease without esophagitis: Secondary | ICD-10-CM | POA: Diagnosis not present

## 2016-05-24 DIAGNOSIS — M1710 Unilateral primary osteoarthritis, unspecified knee: Secondary | ICD-10-CM | POA: Diagnosis present

## 2016-05-24 HISTORY — PX: HARDWARE REMOVAL: SHX979

## 2016-05-24 LAB — CREATININE, SERUM
CREATININE: 0.92 mg/dL (ref 0.44–1.00)
GFR calc Af Amer: >60 mL/min (ref >60)
GFR calc non Af Amer: 59 mL/min — ABNORMAL LOW (ref >60)

## 2016-05-24 LAB — CBC
HCT: 30.5 % — ABNORMAL LOW (ref 36.0–46.0)
Hemoglobin: 9.5 g/dL — ABNORMAL LOW (ref 12.0–15.0)
MCH: 24 pg — AB (ref 26.0–34.0)
MCHC: 31.1 g/dL (ref 30.0–36.0)
MCV: 77 fL — AB (ref 78.0–100.0)
PLATELETS: 265 10*3/uL (ref 150–400)
RBC: 3.96 MIL/uL (ref 3.87–5.11)
RDW: 18.2 % — ABNORMAL HIGH (ref 11.5–15.5)
WBC: 11.3 10*3/uL — ABNORMAL HIGH (ref 4.0–10.5)

## 2016-05-24 LAB — GLUCOSE, CAPILLARY
GLUCOSE-CAPILLARY: 92 mg/dL (ref 65–99)
GLUCOSE-CAPILLARY: 81 mg/dL (ref 65–99)
GLUCOSE-CAPILLARY: 130 mg/dL — AB (ref 65–99)
GLUCOSE-CAPILLARY: 144 mg/dL — AB (ref 65–99)

## 2016-05-24 SURGERY — REMOVAL, HARDWARE
Anesthesia: General | Site: Knee | Laterality: Right

## 2016-05-24 MED ORDER — CEFAZOLIN SODIUM-DEXTROSE 2-4 GM/100ML-% IV SOLN
2.0000 g | INTRAVENOUS | Status: AC
Start: 2016-05-24 — End: 2016-05-24
  Administered 2016-05-24: 2 g via INTRAVENOUS
  Filled 2016-05-24: qty 100

## 2016-05-24 MED ORDER — ENOXAPARIN SODIUM 40 MG/0.4ML ~~LOC~~ SOLN
40.0000 mg | SUBCUTANEOUS | Status: DC
  Administered 2016-05-25 – 2016-05-26 (×2): 40 mg via SUBCUTANEOUS
  Filled 2016-05-24 (×2): qty 0.4

## 2016-05-24 MED ORDER — FERROUS SULFATE 325 (65 FE) MG PO TABS
325.0000 mg | ORAL_TABLET | Freq: Every day | ORAL | Status: DC
  Administered 2016-05-25 – 2016-05-26 (×2): 325 mg via ORAL
  Filled 2016-05-24 (×2): qty 1

## 2016-05-24 MED ORDER — INSULIN ASPART 100 UNIT/ML ~~LOC~~ SOLN: 0.0000 [IU] | Freq: Three times a day (TID) | SUBCUTANEOUS | Status: DC

## 2016-05-24 MED ORDER — MEMANTINE HCL ER 7 MG PO CP24
7.0000 mg | ORAL_CAPSULE | Freq: Every day | ORAL | Status: DC
  Administered 2016-05-25 – 2016-05-26 (×2): 7 mg via ORAL
  Filled 2016-05-24 (×2): qty 1

## 2016-05-24 MED ORDER — PROPOFOL 10 MG/ML IV BOLUS
INTRAVENOUS | Status: DC | PRN
  Administered 2016-05-24: 120 mg via INTRAVENOUS

## 2016-05-24 MED ORDER — ACETAMINOPHEN 10 MG/ML IV SOLN
INTRAVENOUS | Status: AC
  Filled 2016-05-24: qty 100

## 2016-05-24 MED ORDER — LEVOTHYROXINE SODIUM 50 MCG PO TABS
175.0000 ug | ORAL_TABLET | Freq: Every day | ORAL | Status: DC
  Administered 2016-05-25 – 2016-05-26 (×2): 175 ug via ORAL
  Filled 2016-05-24 (×2): qty 1

## 2016-05-24 MED ORDER — CARVEDILOL 3.125 MG PO TABS
3.1250 mg | ORAL_TABLET | Freq: Two times a day (BID) | ORAL | Status: DC
  Administered 2016-05-24 – 2016-05-26 (×5): 3.125 mg via ORAL
  Filled 2016-05-24 (×5): qty 1

## 2016-05-24 MED ORDER — VENLAFAXINE HCL 37.5 MG PO TABS: 37.5000 mg | ORAL_TABLET | Freq: Every day | ORAL | Status: DC

## 2016-05-24 MED ORDER — ATORVASTATIN CALCIUM 10 MG PO TABS
10.0000 mg | ORAL_TABLET | Freq: Every day | ORAL | Status: DC
  Administered 2016-05-25 – 2016-05-26 (×2): 10 mg via ORAL
  Filled 2016-05-24 (×2): qty 1

## 2016-05-24 MED ORDER — ONDANSETRON HCL 4 MG/2ML IJ SOLN: 4.0000 mg | Freq: Four times a day (QID) | INTRAMUSCULAR | Status: DC | PRN

## 2016-05-24 MED ORDER — BUPIVACAINE HCL (PF) 0.25 % IJ SOLN
INTRAMUSCULAR | Status: DC | PRN
  Administered 2016-05-24: 20 mL

## 2016-05-24 MED ORDER — MORPHINE SULFATE (PF) 4 MG/ML IV SOLN
1.0000 mg | INTRAVENOUS | Status: DC | PRN
  Administered 2016-05-24 – 2016-05-25 (×3): 1 mg via INTRAVENOUS
  Filled 2016-05-24 (×4): qty 1

## 2016-05-24 MED ORDER — FUROSEMIDE 40 MG PO TABS
40.0000 mg | ORAL_TABLET | Freq: Every day | ORAL | Status: DC
  Administered 2016-05-25 – 2016-05-26 (×2): 40 mg via ORAL
  Filled 2016-05-24 (×2): qty 1

## 2016-05-24 MED ORDER — ALBUTEROL SULFATE HFA 108 (90 BASE) MCG/ACT IN AERS: 2.0000 | INHALATION_SPRAY | RESPIRATORY_TRACT | Status: DC | PRN

## 2016-05-24 MED ORDER — FENTANYL CITRATE (PF) 100 MCG/2ML IJ SOLN
INTRAMUSCULAR | Status: AC
  Administered 2016-05-24: 50 ug via INTRAVENOUS
  Filled 2016-05-24: qty 2

## 2016-05-24 MED ORDER — ONDANSETRON HCL 4 MG/2ML IJ SOLN
INTRAMUSCULAR | Status: DC | PRN
  Administered 2016-05-24: 4 mg via INTRAVENOUS

## 2016-05-24 MED ORDER — LEVALBUTEROL HCL 0.63 MG/3ML IN NEBU
INHALATION_SOLUTION | RESPIRATORY_TRACT | Status: AC
Start: 2016-05-24 — End: 2016-05-24
  Administered 2016-05-24: 0.63 mg via RESPIRATORY_TRACT
  Filled 2016-05-24: qty 3

## 2016-05-24 MED ORDER — LIDOCAINE HCL (CARDIAC) 20 MG/ML IV SOLN
INTRAVENOUS | Status: DC | PRN
  Administered 2016-05-24: 60 mg via INTRAVENOUS

## 2016-05-24 MED ORDER — ACETAMINOPHEN 650 MG RE SUPP: 650.0000 mg | Freq: Four times a day (QID) | RECTAL | Status: DC | PRN

## 2016-05-24 MED ORDER — ONDANSETRON HCL 4 MG PO TABS: 4.0000 mg | ORAL_TABLET | Freq: Four times a day (QID) | ORAL | Status: DC | PRN

## 2016-05-24 MED ORDER — MOMETASONE FURO-FORMOTEROL FUM 200-5 MCG/ACT IN AERO: 2.0000 | INHALATION_SPRAY | Freq: Two times a day (BID) | RESPIRATORY_TRACT | Status: DC | PRN

## 2016-05-24 MED ORDER — PROPOFOL 10 MG/ML IV BOLUS
INTRAVENOUS | Status: AC
  Filled 2016-05-24: qty 20

## 2016-05-24 MED ORDER — CEFAZOLIN SODIUM-DEXTROSE 2-4 GM/100ML-% IV SOLN
INTRAVENOUS | Status: AC
  Filled 2016-05-24: qty 100

## 2016-05-24 MED ORDER — FLUTICASONE PROPIONATE 50 MCG/ACT NA SUSP: 1.0000 | Freq: Every day | NASAL | Status: DC | PRN

## 2016-05-24 MED ORDER — PANTOPRAZOLE SODIUM 40 MG PO TBEC
80.0000 mg | DELAYED_RELEASE_TABLET | Freq: Two times a day (BID) | ORAL | Status: DC
  Administered 2016-05-24 – 2016-05-25 (×2): 80 mg via ORAL
  Filled 2016-05-24 (×2): qty 2

## 2016-05-24 MED ORDER — FENTANYL CITRATE (PF) 100 MCG/2ML IJ SOLN
25.0000 ug | INTRAMUSCULAR | Status: DC | PRN
  Administered 2016-05-24: 25 ug via INTRAVENOUS
  Administered 2016-05-24: 50 ug via INTRAVENOUS
  Administered 2016-05-24: 25 ug via INTRAVENOUS
  Administered 2016-05-24: 50 ug via INTRAVENOUS

## 2016-05-24 MED ORDER — METHOCARBAMOL 1000 MG/10ML IJ SOLN
500.0000 mg | Freq: Four times a day (QID) | INTRAVENOUS | Status: DC | PRN
  Administered 2016-05-24: 500 mg via INTRAVENOUS
  Filled 2016-05-24: qty 550
  Filled 2016-05-24: qty 5

## 2016-05-24 MED ORDER — POVIDONE-IODINE 10 % EX SWAB: 2.0000 "application " | Freq: Once | CUTANEOUS | Status: DC

## 2016-05-24 MED ORDER — PROMETHAZINE HCL 25 MG/ML IJ SOLN: 6.2500 mg | INTRAMUSCULAR | Status: DC | PRN

## 2016-05-24 MED ORDER — MORPHINE SULFATE (PF) 2 MG/ML IV SOLN: 1.0000 mg | INTRAVENOUS | Status: DC | PRN

## 2016-05-24 MED ORDER — LACTATED RINGERS IV SOLN
INTRAVENOUS | Status: DC
  Administered 2016-05-24: 15:00:00 via INTRAVENOUS

## 2016-05-24 MED ORDER — LATANOPROST 0.005 % OP SOLN
1.0000 [drp] | Freq: Every day | OPHTHALMIC | Status: DC
  Administered 2016-05-25: 1 [drp] via OPHTHALMIC
  Filled 2016-05-24: qty 2.5

## 2016-05-24 MED ORDER — ACETAMINOPHEN 325 MG PO TABS: 650.0000 mg | ORAL_TABLET | Freq: Four times a day (QID) | ORAL | Status: DC | PRN

## 2016-05-24 MED ORDER — FENTANYL CITRATE (PF) 100 MCG/2ML IJ SOLN
INTRAMUSCULAR | Status: DC | PRN
  Administered 2016-05-24: 25 ug via INTRAVENOUS
  Administered 2016-05-24: 50 ug via INTRAVENOUS
  Administered 2016-05-24: 25 ug via INTRAVENOUS

## 2016-05-24 MED ORDER — SODIUM CHLORIDE 0.9 % IV SOLN
INTRAVENOUS | Status: DC
  Administered 2016-05-24: 19:00:00 via INTRAVENOUS

## 2016-05-24 MED ORDER — METOCLOPRAMIDE HCL 5 MG/ML IJ SOLN: 5.0000 mg | Freq: Three times a day (TID) | INTRAMUSCULAR | Status: DC | PRN

## 2016-05-24 MED ORDER — FENTANYL CITRATE (PF) 100 MCG/2ML IJ SOLN
INTRAMUSCULAR | Status: AC
  Administered 2016-05-24: 25 ug via INTRAVENOUS
  Filled 2016-05-24: qty 2

## 2016-05-24 MED ORDER — ONDANSETRON HCL 4 MG/2ML IJ SOLN
INTRAMUSCULAR | Status: AC
  Filled 2016-05-24: qty 2

## 2016-05-24 MED ORDER — LORATADINE 10 MG PO TABS: 10.0000 mg | ORAL_TABLET | Freq: Every day | ORAL | Status: DC | PRN

## 2016-05-24 MED ORDER — OXYCODONE HCL 5 MG PO TABS
5.0000 mg | ORAL_TABLET | ORAL | Status: DC | PRN
  Filled 2016-05-24: qty 1

## 2016-05-24 MED ORDER — LEVALBUTEROL HCL 0.63 MG/3ML IN NEBU
0.6300 mg | INHALATION_SOLUTION | Freq: Once | RESPIRATORY_TRACT | Status: AC
  Administered 2016-05-24: 0.63 mg via RESPIRATORY_TRACT

## 2016-05-24 MED ORDER — METOCLOPRAMIDE HCL 5 MG PO TABS: 5.0000 mg | ORAL_TABLET | Freq: Three times a day (TID) | ORAL | Status: DC | PRN

## 2016-05-24 MED ORDER — METHOCARBAMOL 500 MG PO TABS
500.0000 mg | ORAL_TABLET | Freq: Four times a day (QID) | ORAL | Status: DC | PRN
  Administered 2016-05-24 – 2016-05-25 (×3): 500 mg via ORAL
  Filled 2016-05-24 (×3): qty 1

## 2016-05-24 MED ORDER — ACETAMINOPHEN 500 MG PO TABS
1000.0000 mg | ORAL_TABLET | Freq: Four times a day (QID) | ORAL | Status: AC
  Administered 2016-05-24 – 2016-05-25 (×4): 1000 mg via ORAL
  Filled 2016-05-24 (×5): qty 2

## 2016-05-24 MED ORDER — VENLAFAXINE HCL ER 37.5 MG PO CP24
37.5000 mg | ORAL_CAPSULE | Freq: Every day | ORAL | Status: DC
  Administered 2016-05-25 – 2016-05-26 (×2): 37.5 mg via ORAL
  Filled 2016-05-24 (×2): qty 1

## 2016-05-24 MED ORDER — ACETAMINOPHEN 10 MG/ML IV SOLN
1000.0000 mg | Freq: Once | INTRAVENOUS | Status: AC
  Administered 2016-05-24: 1000 mg via INTRAVENOUS
  Filled 2016-05-24: qty 100

## 2016-05-24 MED ORDER — PANTOPRAZOLE SODIUM 40 MG PO TBEC: 80.0000 mg | DELAYED_RELEASE_TABLET | Freq: Two times a day (BID) | ORAL | Status: DC

## 2016-05-24 MED ORDER — ALBUTEROL SULFATE (2.5 MG/3ML) 0.083% IN NEBU: 2.5000 mg | INHALATION_SOLUTION | RESPIRATORY_TRACT | Status: DC | PRN

## 2016-05-24 MED ORDER — PREGABALIN 100 MG PO CAPS
100.0000 mg | ORAL_CAPSULE | Freq: Two times a day (BID) | ORAL | Status: DC
  Administered 2016-05-24 – 2016-05-26 (×4): 100 mg via ORAL
  Filled 2016-05-24 (×4): qty 1

## 2016-05-24 MED ORDER — ISOSORBIDE DINITRATE 20 MG PO TABS
20.0000 mg | ORAL_TABLET | Freq: Two times a day (BID) | ORAL | Status: DC
  Administered 2016-05-24 – 2016-05-26 (×4): 20 mg via ORAL
  Filled 2016-05-24 (×5): qty 1

## 2016-05-24 MED ORDER — LOSARTAN POTASSIUM 25 MG PO TABS
25.0000 mg | ORAL_TABLET | Freq: Every evening | ORAL | Status: DC
  Administered 2016-05-24 – 2016-05-25 (×2): 25 mg via ORAL
  Filled 2016-05-24 (×2): qty 1

## 2016-05-24 MED ORDER — POTASSIUM CHLORIDE 20 MEQ PO PACK
20.0000 meq | PACK | Freq: Every day | ORAL | Status: DC
  Filled 2016-05-24: qty 1

## 2016-05-24 MED ORDER — 0.9 % SODIUM CHLORIDE (POUR BTL) OPTIME
TOPICAL | Status: DC | PRN
  Administered 2016-05-24: 1000 mL

## 2016-05-24 MED ORDER — FENTANYL CITRATE (PF) 100 MCG/2ML IJ SOLN
INTRAMUSCULAR | Status: AC
  Filled 2016-05-24: qty 2

## 2016-05-24 MED ORDER — CEFAZOLIN SODIUM-DEXTROSE 2-4 GM/100ML-% IV SOLN
2.0000 g | Freq: Four times a day (QID) | INTRAVENOUS | Status: AC
  Administered 2016-05-24 – 2016-05-25 (×3): 2 g via INTRAVENOUS
  Filled 2016-05-24 (×3): qty 100

## 2016-05-24 MED ORDER — BUPIVACAINE HCL (PF) 0.25 % IJ SOLN
INTRAMUSCULAR | Status: AC
  Filled 2016-05-24: qty 30

## 2016-05-24 SURGICAL SUPPLY — 42 items
BANDAGE ACE 6X5 VEL STRL LF (GAUZE/BANDAGES/DRESSINGS) ×3 IMPLANT
BANDAGE ESMARK 6X9 LF (GAUZE/BANDAGES/DRESSINGS) ×1 IMPLANT
BNDG CMPR 9X6 STRL LF SNTH (GAUZE/BANDAGES/DRESSINGS) ×1
BNDG ESMARK 6X9 LF (GAUZE/BANDAGES/DRESSINGS) ×3
CUFF TOURN SGL QUICK 44 (TOURNIQUET CUFF) ×2 IMPLANT
DRAPE C-ARM 42X120 X-RAY (DRAPES) ×3 IMPLANT
DRAPE C-ARMOR (DRAPES) ×3 IMPLANT
DRAPE EXTREMITY T 121X128X90 (DRAPE) ×3 IMPLANT
DRAPE INCISE IOBAN 66X45 STRL (DRAPES) ×3 IMPLANT
DRAPE ORTHO SPLIT 77X108 STRL (DRAPES)
DRAPE SURG ORHT 6 SPLT 77X108 (DRAPES) IMPLANT
DRSG ADAPTIC 3X8 NADH LF (GAUZE/BANDAGES/DRESSINGS) ×3 IMPLANT
DURAPREP 26ML APPLICATOR (WOUND CARE) ×3 IMPLANT
ELECT REM PT RETURN 9FT ADLT (ELECTROSURGICAL) ×3
ELECTRODE REM PT RTRN 9FT ADLT (ELECTROSURGICAL) ×1 IMPLANT
GAUZE SPONGE 4X4 12PLY STRL (GAUZE/BANDAGES/DRESSINGS) ×3 IMPLANT
GLOVE BIO SURGEON STRL SZ7.5 (GLOVE) ×3 IMPLANT
GLOVE BIO SURGEON STRL SZ8 (GLOVE) ×6 IMPLANT
GLOVE BIOGEL PI IND STRL 7.5 (GLOVE) IMPLANT
GLOVE BIOGEL PI IND STRL 8 (GLOVE) ×3 IMPLANT
GLOVE BIOGEL PI INDICATOR 7.5 (GLOVE) ×8
GLOVE BIOGEL PI INDICATOR 8 (GLOVE) ×6
GOWN STRL REUS W/TWL LRG LVL3 (GOWN DISPOSABLE) ×3 IMPLANT
GOWN STRL REUS W/TWL XL LVL3 (GOWN DISPOSABLE) ×5 IMPLANT
KIT BASIN OR (CUSTOM PROCEDURE TRAY) ×3 IMPLANT
MANIFOLD NEPTUNE II (INSTRUMENTS) ×3 IMPLANT
NDL SAFETY ECLIPSE 18X1.5 (NEEDLE) IMPLANT
NEEDLE HYPO 18GX1.5 SHARP (NEEDLE) ×3
PACK TOTAL JOINT (CUSTOM PROCEDURE TRAY) ×3 IMPLANT
PAD ABD 8X10 STRL (GAUZE/BANDAGES/DRESSINGS) ×2 IMPLANT
PADDING CAST COTTON 6X4 STRL (CAST SUPPLIES) ×8 IMPLANT
POSITIONER SURGICAL ARM (MISCELLANEOUS) ×3 IMPLANT
STAPLER VISISTAT 35W (STAPLE) ×2 IMPLANT
SUCTION FRAZIER 12FR DISP (SUCTIONS) ×2 IMPLANT
SUT MNCRL AB 4-0 PS2 18 (SUTURE) ×1 IMPLANT
SUT VIC AB 0 CT1 36 (SUTURE) ×6 IMPLANT
SUT VIC AB 2-0 CT1 27 (SUTURE) ×6
SUT VIC AB 2-0 CT1 TAPERPNT 27 (SUTURE) ×2 IMPLANT
SYR 20CC LL (SYRINGE) ×2 IMPLANT
TOWEL OR 17X26 10 PK STRL BLUE (TOWEL DISPOSABLE) ×6 IMPLANT
UNDERPAD 30X30 INCONTINENT (UNDERPADS AND DIAPERS) ×3 IMPLANT
WRAP KNEE MAXI GEL POST OP (GAUZE/BANDAGES/DRESSINGS) ×2 IMPLANT

## 2016-05-24 NOTE — Anesthesia Procedure Notes (Signed)
Procedure Name: LMA Insertion Date/Time: 05/24/2016 3:30 PM Performed by: Thornell MuleSTUBBLEFIELD, Kamarion Zagami G Pre-anesthesia Checklist: Patient identified, Emergency Drugs available, Suction available and Patient being monitored Patient Re-evaluated:Patient Re-evaluated prior to inductionOxygen Delivery Method: Circle system utilized Preoxygenation: Pre-oxygenation with 100% oxygen Intubation Type: IV induction LMA: LMA with gastric port inserted LMA Size: 4.0 Number of attempts: 1 Placement Confirmation: positive ETCO2 Tube secured with: Tape Dental Injury: Teeth and Oropharynx as per pre-operative assessment

## 2016-05-24 NOTE — Anesthesia Postprocedure Evaluation (Addendum)
Anesthesia Post Note  Patient: Emily Cross  Procedure(s) Performed: Procedure(s) (LRB): HARDWARE REMOVAL RIGHT KNEE (Right)  Patient location during evaluation: PACU Anesthesia Type: General Level of consciousness: awake and alert Pain management: pain level controlled Vital Signs Assessment: post-procedure vital signs reviewed and stable Respiratory status: spontaneous breathing, nonlabored ventilation, respiratory function stable and patient connected to nasal cannula oxygen Cardiovascular status: blood pressure returned to baseline and stable Postop Assessment: no signs of nausea or vomiting Anesthetic complications: no       Last Vitals:  Vitals:   05/24/16 1730 05/24/16 1745  BP: 117/76 122/80  Pulse: (!) 103 98  Resp: 11 15  Temp:      Last Pain:  Vitals:   05/24/16 1745  TempSrc:   PainSc: 0-No pain                 Armin Yerger S

## 2016-05-24 NOTE — Interval H&P Note (Signed)
History and Physical Interval Note:  05/24/2016 3:18 PM  Emily Cross  has presented today for surgery, with the diagnosis of painful hardware right knee  The various methods of treatment have been discussed with the patient and family. After consideration of risks, benefits and other options for treatment, the patient has consented to  Procedure(s): HARDWARE REMOVAL right knee (Right) as a surgical intervention .  The patient's history has been reviewed, patient examined, no change in status, stable for surgery.  I have reviewed the patient's chart and labs.  Questions were answered to the patient's satisfaction.     Loanne DrillingALUISIO,Linell Meldrum V

## 2016-05-24 NOTE — Anesthesia Preprocedure Evaluation (Addendum)
Anesthesia Evaluation  Patient identified by MRN, date of birth, ID band Patient awake    Reviewed: Allergy & Precautions, NPO status , Patient's Chart, lab work & pertinent test results  Airway Mallampati: II  TM Distance: <3 FB Neck ROM: Full    Dental no notable dental hx.    Pulmonary sleep apnea , COPD, former smoker,    breath sounds clear to auscultation + decreased breath sounds      Cardiovascular hypertension, Normal cardiovascular exam Rhythm:Regular Rate:Normal     Neuro/Psych negative neurological ROS  negative psych ROS   GI/Hepatic negative GI ROS, Neg liver ROS,   Endo/Other  diabetesHypothyroidism   Renal/GU negative Renal ROS  negative genitourinary   Musculoskeletal negative musculoskeletal ROS (+)   Abdominal   Peds negative pediatric ROS (+)  Hematology negative hematology ROS (+)   Anesthesia Other Findings   Reproductive/Obstetrics negative OB ROS                             Anesthesia Physical Anesthesia Plan  ASA: III  Anesthesia Plan: General   Post-op Pain Management:    Induction: Intravenous  Airway Management Planned: LMA  Additional Equipment:   Intra-op Plan:   Post-operative Plan: Extubation in OR  Informed Consent: I have reviewed the patients History and Physical, chart, labs and discussed the procedure including the risks, benefits and alternatives for the proposed anesthesia with the patient or authorized representative who has indicated his/her understanding and acceptance.   Dental advisory given  Plan Discussed with: CRNA and Surgeon  Anesthesia Plan Comments:         Anesthesia Quick Evaluation

## 2016-05-24 NOTE — Transfer of Care (Signed)
Immediate Anesthesia Transfer of Care Note  Patient: Emily Cross  Procedure(s) Performed: Procedure(s): HARDWARE REMOVAL RIGHT KNEE (Right)  Patient Location: PACU  Anesthesia Type:General  Level of Consciousness: awake, alert  and oriented  Airway & Oxygen Therapy: Patient Spontanous Breathing and Patient connected to face mask oxygen  Post-op Assessment: Report given to RN and Post -op Vital signs reviewed and stable  Post vital signs: Reviewed and stable  Last Vitals:  Vitals:   05/24/16 1146  BP: (!) 142/62  Pulse: 88  Resp: 18  Temp: 37 C    Last Pain:  Vitals:   05/24/16 1202  TempSrc:   PainSc: 3       Patients Stated Pain Goal: 4 (05/24/16 1202)  Complications: No apparent anesthesia complications

## 2016-05-24 NOTE — H&P (Signed)
[name] is a 76 y.o. female who presents with right knee pain.  HPI- . Knee Pain: Patient presents with knee pain involving the  right knee. Onset of the symptoms was several years ago. Inciting event: this is a longstanding problem which has been getting worse. Current symptoms include global knee pain. she has had a previous tibial osteotomy with hardware in her right tibia. 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 She presents today for hardware removal from her right knee with eventual TKA once fully healed from this..   Past Medical History:  Diagnosis Date  . Anemia   . Anxiety   . Arthritis    oa and ra  . CHF (congestive heart failure) (HCC)   . COPD (chronic obstructive pulmonary disease) (HCC)   . Dementia    mild  . Depression   . Diabetes mellitus without complication (HCC)    type 2  . Dyspnea    with exertion  . Emphysema of lung (HCC)   . Fibromyalgia   . GERD (gastroesophageal reflux disease)   . Headache   . Heart valve regurgitation   . High cholesterol   . Hyperlipidemia   . Hypertension   . Hypothyroidism   . Osteoporosis   . Pneumonia yrs ago  . Sleep apnea   . Tinnitus    both ears  . Vitamin D deficiency     Past Surgical History:  Procedure Laterality Date  . ABDOMINAL HYSTERECTOMY     partial  . bladder tach     2 or 3  . BREAST BIOPSY Left    benign findings  . BREAST SURGERY    . CHOLECYSTECTOMY    . COLONOSCOPY  07/2014  . EYE SURGERY Bilateral    ioc for cataracts  . hardware placed in right knee  20-25 yrs ago  . HEAD & NECK SKIN LESION EXCISIONAL BIOPSY     benign  . KNEE SURGERY Bilateral 15-20 yrs ago   arthroscopy  . TONSILLECTOMY      Prior to Admission medications   Medication Sig Start Date End Date Taking? Authorizing Provider  acetaminophen (TYLENOL) 325 MG tablet Take 650 mg by mouth every 6 (six) hours as needed for mild pain.    Yes Historical Provider,  MD  albuterol (PROVENTIL HFA;VENTOLIN HFA) 108 (90 BASE) MCG/ACT inhaler Inhale 2 puffs into the lungs every 4 (four) hours as needed for wheezing.    Yes Historical Provider, MD  aspirin EC 81 MG tablet Take 81 mg by mouth daily.   Yes Historical Provider, MD  atorvastatin (LIPITOR) 10 MG tablet Take 10 mg by mouth daily.   Yes Historical Provider, MD  carvedilol (COREG) 3.125 MG tablet Take 3.125 mg by mouth 2 (two) times daily with a meal.   Yes Historical Provider, MD  Cholecalciferol (VITAMIN D3) 2000 units TABS Take 1 tablet by mouth daily.    Yes Historical Provider, MD  esomeprazole (NEXIUM) 40 MG capsule Take 40 mg by mouth 2 (two) times daily.    Yes Historical Provider, MD  ferrous sulfate 325 (65 FE) MG tablet Take 325 mg by mouth daily with breakfast.   Yes Historical Provider, MD  fluticasone (FLONASE) 50 MCG/ACT nasal spray Place 1 spray into both nostrils daily as needed for rhinitis.   Yes Historical Provider, MD  Fluticasone-Salmeterol (ADVAIR) 500-50 MCG/DOSE AEPB Inhale 1 puff into the lungs 2 (two) times daily as needed (  shortness [id number] breath).    Yes Historical Provider, MD  furosemide (LASIX) 40 MG tablet Take 40 mg by mouth daily.    Yes Historical Provider, MD  isosorbide dinitrate (ISORDIL) 20 MG tablet Take 20 mg by mouth 2 (two) times daily.    Yes Historical Provider, MD  latanoprost (XALATAN) 0.005 % ophthalmic solution Place 1 drop into both eyes at bedtime.   Yes Historical Provider, MD  levothyroxine (SYNTHROID, LEVOTHROID) 175 MCG tablet Take 175 mcg by mouth daily before breakfast.   Yes Historical Provider, MD  lidocaine (LIDODERM) 5 % Place 1 patch onto the skin daily as needed (pain). Remove & Discard patch within 12 hours or as directed by MD    Yes Historical Provider, MD  loratadine (CLARITIN) 10 MG tablet Take 10 mg by mouth daily as needed for allergies. Every morning prn   Yes Historical Provider, MD  losartan (COZAAR) 25 MG tablet Take 25 mg by mouth every  evening.    Yes Historical Provider, MD  memantine (NAMENDA XR) 7 MG CP24 24 hr capsule Take 7 mg by mouth daily.   Yes Historical Provider, MD  metFORMIN (GLUCOPHAGE) 1000 MG tablet Take 1,000 mg by mouth 2 (two) times daily with a meal.   Yes Historical Provider, MD  nystatin cream (MYCOSTATIN) Apply 1 application topically 2 (two) times daily as needed for dry skin.   Yes Historical Provider, MD  polyethylene glycol (MIRALAX / GLYCOLAX) packet Take 17 g by mouth daily as needed for mild constipation.    Yes Historical Provider, MD  potassium chloride (KLOR-CON) 20 MEQ packet Take 20 mEq by mouth daily.    Yes Historical Provider, MD  pregabalin (LYRICA) 100 MG capsule Take 100 mg by mouth 2 (two) times daily.   Yes Historical Provider, MD  venlafaxine (EFFEXOR) 37.5 MG tablet Take 37.5 mg by mouth daily.    Yes Historical Provider, MD   KNEE EXAM antalgic gait, no effusion, reduced range of motion, negative drawer sign, collateral ligaments intact  Physical Examination: General appearance - alert, well appearing, and in no distress Mental status - alert, oriented to person, place, and time Chest - clear to auscultation, no wheezes, rales or rhonchi, symmetric air entry Heart - normal rate, regular rhythm, normal S1, S2, no murmurs, rubs, clicks or gallops Abdomen - soft, nontender, nondistended, no masses or organomegaly Neurological - alert, oriented, normal speech, no focal findings or movement disorder noted   Asessment/Plan--- Right knee osteoarthritis with retained hardware- - Plan right knee hardware removal Procedure risks and potential comps discussed with patient who elects to proceed. Goals are decreased pain and increased function with a high likelihood of achieving both

## 2016-05-24 NOTE — Op Note (Signed)
NAMJohnnye Sima:  Emily Cross, Emily Cross                  ACCOUNT NO.:  [id number]  MEDICAL RECORD NO.:  [id number]  LOCATION:                                 FACILITY:  PHYSICIAN:  Ollen GrossFrank Damonte Frieson, M.D.    DATE OF BIRTH:  16-Oct-1940  DATE OF PROCEDURE:  05/24/2016 DATE OF DISCHARGE:                              OPERATIVE REPORT   PREOPERATIVE DIAGNOSIS:  Retained hardware, right knee.  POSTOPERATIVE DIAGNOSIS:  Retained hardware, right knee.  PROCEDURE:  Hardware removal, right knee.  SURGEON:  Ollen GrossFrank Kris No, M.D.  ASSISTANT:  Alexzandrew L. Perkins, PA-C.  ANESTHESIA:  General.  ESTIMATED BLOOD LOSS:  Minimal.  DRAINS:  None.  TOURNIQUET TIME:  46 minutes at 300 mmHg.  COMPLICATIONS:  None.  CONDITION:  Stable to Recovery.  CLINICAL NOTE:  Ms. [name] is a 76 year old female who had a high tibial osteotomy done several years ago in treatment for her arthritis.  She has gone on to develop end-stage arthritis of the right knee, which will necessitate a total knee arthroplasty.  She has retained hardware which appears to be a locking [employer] plate from her osteotomy.  That needs to be removed and the wound needs to heal prior to performing the total knee arthroplasty.  She presents now for hardware removal.  PROCEDURE IN DETAIL:  After successful administration of general anesthetic, a tourniquet was placed high on her right thigh and her right lower extremity was prepped and draped in the usual sterile fashion.  Extremities wrapped in Esmarch, and tourniquet inflated to 300 mmHg.  She had a previous lateral incision which was utilized.  Skin cut with 10 blade to subcutaneous tissue.  Plate was palpated under the fascia.  An incision made in the fascia and the plate identified.  This was a locking plate that had 2 tines in it similar to staples.  It is a 2 piece, but it was locked together and thus had to be removed as a solitary unit.  It was very difficult to extract.  I had to  use osteotomes to at least superficially disrupt the contact tear between bone and the stapled portion.  We used multiple different instruments to disrupt the interface and eventually we were able to remove the hardware in 1 piece with minimal bone disruption.  Once the hardware was removed, we thoroughly irrigated the wound with normal saline and then closed the fascia with interrupted #1 Vicryl.  Tourniquet was released with a total time of 46 minutes.  Minimal bleeding was identified.  A 20 mL of 0.25% Marcaine was injected into the subcu tissues.  Subcu was then closed with interrupted 2-0 Vicryl, and skin closed with staples.  Incisions cleaned and dried and a bulky sterile dressing applied.  She was then awakened and transported to recovery in stable condition.  Note that a surgical assistant was a medical necessity for this procedure as it was arduous and difficult to remove the hardware and retraction of vital neurovascular structures was necessary.     Ollen GrossFrank Teddi Badalamenti, M.D.     FA/MEDQ  D:  05/24/2016  T:  05/24/2016  Job:  [id number]

## 2016-05-24 NOTE — Brief Op Note (Signed)
05/24/2016  4:37 PM  PATIENT:  Emily Cross  76 y.o. female  PRE-OPERATIVE DIAGNOSIS:  painful hardware right knee  POST-OPERATIVE DIAGNOSIS:  painful hardware right knee  PROCEDURE:  Procedure(s): HARDWARE REMOVAL RIGHT KNEE (Right)  SURGEON:  Surgeon(s) and Role:    * Ollen GrossFrank Regina Coppolino, MD - Primary  PHYSICIAN ASSISTANT:   ASSISTANTS: Avel Peacerew Perkins, PA-C   ANESTHESIA:   general  EBL:  No intake/output data recorded.  BLOOD ADMINISTERED:none  DRAINS: none   LOCAL MEDICATIONS USED:  MARCAINE     COUNTS:  YES  TOURNIQUET: 46 minutes @300  mm Hg  DICTATION: .Other Dictation: Dictation Number [id number]  PLAN OF CARE: Admit for overnight observation  PATIENT DISPOSITION:  PACU - hemodynamically stable.

## 2016-05-25 ENCOUNTER — Encounter (HOSPITAL_COMMUNITY): Payer: Self-pay | Admitting: Orthopedic Surgery

## 2016-05-25 DIAGNOSIS — Z472 Encounter for removal of internal fixation device: Secondary | ICD-10-CM | POA: Diagnosis not present

## 2016-05-25 LAB — GLUCOSE, CAPILLARY
GLUCOSE-CAPILLARY: 128 mg/dL — AB (ref 65–99)
Glucose-Capillary: 119 mg/dL — ABNORMAL HIGH (ref 65–99)
Glucose-Capillary: 120 mg/dL — ABNORMAL HIGH (ref 65–99)
Glucose-Capillary: 119 mg/dL — ABNORMAL HIGH (ref 65–99)

## 2016-05-25 MED ORDER — ESOMEPRAZOLE MAGNESIUM 40 MG PO CPDR
40.0000 mg | DELAYED_RELEASE_CAPSULE | Freq: Two times a day (BID) | ORAL | Status: DC
  Administered 2016-05-26: 40 mg via ORAL
  Filled 2016-05-25 (×3): qty 1

## 2016-05-25 MED ORDER — HYDROMORPHONE HCL 2 MG PO TABS
2.0000 mg | ORAL_TABLET | ORAL | Status: DC | PRN
  Administered 2016-05-25 – 2016-05-26 (×7): 4 mg via ORAL
  Filled 2016-05-25 (×7): qty 2

## 2016-05-25 MED ORDER — NON FORMULARY: 40.0000 mg | Freq: Two times a day (BID) | Status: DC

## 2016-05-25 MED ORDER — POTASSIUM CHLORIDE 20 MEQ/15ML (10%) PO SOLN
20.0000 meq | Freq: Every day | ORAL | Status: DC
Start: 2016-05-25 — End: 2016-05-26
  Administered 2016-05-25 – 2016-05-26 (×2): 20 meq via ORAL
  Filled 2016-05-25 (×2): qty 15

## 2016-05-25 NOTE — NC FL2 (Signed)
[place] MEDICAID FL2 LEVEL OF CARE SCREENING TOOL     IDENTIFICATION  Patient Name: Emily Cross Birthdate: 11/14/1940 Sex: female Admission Date (Current Location): 05/24/2016  [facility] [place]   Facility and Address:  [facility],  [address], [place] [id number]      Provider Number: [id number]  Attending Physician Name and Address:  Ollen GrossFrank Aluisio, MD  Relative Name and Phone Number:       Current Level of Care: Hospital Recommended Level of Care: Skilled Nursing Facility Prior Approval Number:    Date Approved/Denied:   PASRR Number: [id number]  Discharge Plan: SNF    Current Diagnoses: Patient Active Problem List   Diagnosis Date Noted  . OA (osteoarthritis) of knee 05/24/2016  . AKI (acute kidney injury) (HCC) 02/24/2016  . Orthostatic hypotension 02/24/2016  . Abdominal pain 02/24/2016  . Low back pain 02/24/2016  . CHF (congestive heart failure) (HCC) 02/24/2016  . Diabetes mellitus without complication (HCC) 02/24/2016  . Rash and nonspecific skin eruption 02/24/2016  . GERD (gastroesophageal reflux disease) 02/24/2016  . COPD (chronic obstructive pulmonary disease) (HCC) 02/24/2016  . Dementia without behavioral disturbance 02/24/2016  . Hyperlipidemia     Orientation RESPIRATION BLADDER Height & Weight     Self, Time, Situation, Place  Normal Continent Weight: 243 lb (110.2 kg) Height:  4\' 11"  (149.9 cm)  BEHAVIORAL SYMPTOMS/MOOD NEUROLOGICAL BOWEL NUTRITION STATUS  Other (Comment) (no behaviors)   Continent Diet  AMBULATORY STATUS COMMUNICATION OF NEEDS Skin   Extensive Assist Verbally Surgical wounds                       Personal Care Assistance Level of Assistance  Bathing, Feeding, Dressing Bathing Assistance: Limited assistance Feeding assistance: Independent Dressing Assistance: Limited assistance     Functional Limitations Info  Sight, Hearing, Speech Sight Info: Adequate Hearing Info:  Adequate Speech Info: Adequate    SPECIAL CARE FACTORS FREQUENCY  PT (By licensed PT), OT (By licensed OT)     PT Frequency: 5x wk OT Frequency: 5x wk            Contractures Contractures Info: Not present    Additional Factors Info  Code Status Code Status Info: Full Code             Current Medications (05/25/2016):  This is the current hospital active medication list Current Facility-Administered Medications  Medication Dose Route Frequency Provider Last Rate Last Dose  . 0.9 %  sodium chloride infusion   Intravenous Continuous Alexzandrew L Perkins, PA-C 10 mL/hr at 05/25/16 1042 10 mL at 05/25/16 1042  . acetaminophen (TYLENOL) tablet 650 mg  650 mg Oral Q6H PRN Ollen GrossFrank Aluisio, MD       Or  . acetaminophen (TYLENOL) suppository 650 mg  650 mg Rectal Q6H PRN Ollen GrossFrank Aluisio, MD      . acetaminophen (TYLENOL) tablet 1,000 mg  1,000 mg Oral Q6H Ollen GrossFrank Aluisio, MD   1,000 mg at 05/25/16 0525  . albuterol (PROVENTIL) (2.5 MG/3ML) 0.083% nebulizer solution 2.5 mg  2.5 mg Nebulization Q4H PRN Ollen GrossFrank Aluisio, MD      . atorvastatin (LIPITOR) tablet 10 mg  10 mg Oral Daily Ollen GrossFrank Aluisio, MD   10 mg at 05/25/16 1040  . carvedilol (COREG) tablet 3.125 mg  3.125 mg Oral BID WC Ollen GrossFrank Aluisio, MD   3.125 mg at 05/25/16 0835  . enoxaparin (LOVENOX) injection 40 mg  40 mg Subcutaneous Q24H Homero FellersFrank  Aluisio, MD   40 mg at 05/25/16 0837  . esomeprazole (NEXIUM) capsule 40 mg  40 mg Oral BID AC Ollen Gross, MD      . ferrous sulfate tablet 325 mg  325 mg Oral Q breakfast Ollen Gross, MD   325 mg at 05/25/16 0836  . fluticasone (FLONASE) 50 MCG/ACT nasal spray 1 spray  1 spray Each Nare Daily PRN Ollen Gross, MD      . furosemide (LASIX) tablet 40 mg  40 mg Oral Daily Ollen Gross, MD   40 mg at 05/25/16 1040  . HYDROmorphone (DILAUDID) tablet 2-4 mg  2-4 mg Oral Q4H PRN Ollen Gross, MD   4 mg at 05/25/16 0835  . insulin aspart (novoLOG) injection 0-15 Units  0-15 Units Subcutaneous TID WC  Ollen Gross, MD      . isosorbide dinitrate (ISORDIL) tablet 20 mg  20 mg Oral BID Ollen Gross, MD   20 mg at 05/25/16 1040  . latanoprost (XALATAN) 0.005 % ophthalmic solution 1 drop  1 drop Both Eyes QHS Ollen Gross, MD      . levothyroxine (SYNTHROID, LEVOTHROID) tablet 175 mcg  175 mcg Oral QAC breakfast Ollen Gross, MD   175 mcg at 05/25/16 0836  . loratadine (CLARITIN) tablet 10 mg  10 mg Oral Daily PRN Ollen Gross, MD      . losartan (COZAAR) tablet 25 mg  25 mg Oral QPM Ollen Gross, MD   25 mg at 05/24/16 2017  . memantine (NAMENDA XR) 24 hr capsule 7 mg  7 mg Oral Daily Ollen Gross, MD   7 mg at 05/25/16 1040  . methocarbamol (ROBAXIN) tablet 500 mg  500 mg Oral Q6H PRN Ollen Gross, MD   500 mg at 05/25/16 0537   Or  . methocarbamol (ROBAXIN) 500 mg in dextrose 5 % 50 mL IVPB  500 mg Intravenous Q6H PRN Ollen Gross, MD   500 mg at 05/24/16 1719  . metoCLOPramide (REGLAN) tablet 5-10 mg  5-10 mg Oral Q8H PRN Ollen Gross, MD       Or  . metoCLOPramide (REGLAN) injection 5-10 mg  5-10 mg Intravenous Q8H PRN Ollen Gross, MD      . mometasone-formoterol [facility]) 200-5 MCG/ACT inhaler 2 puff  2 puff Inhalation BID PRN Ollen Gross, MD      . morphine 4 MG/ML injection 1 mg  1 mg Intravenous Q2H PRN Ollen Gross, MD   1 mg at 05/25/16 0537  . ondansetron (ZOFRAN) tablet 4 mg  4 mg Oral Q6H PRN Ollen Gross, MD       Or  . ondansetron [facility]) injection 4 mg  4 mg Intravenous Q6H PRN Ollen Gross, MD      . potassium chloride 20 MEQ/15ML (10%) solution 20 mEq  20 mEq Oral Daily Ollen Gross, MD   20 mEq at 05/25/16 1040  . pregabalin (LYRICA) capsule 100 mg  100 mg Oral BID Ollen Gross, MD   100 mg at 05/25/16 1040  . venlafaxine XR (EFFEXOR-XR) 24 hr capsule 37.5 mg  37.5 mg Oral Q breakfast Ollen Gross, MD   37.5 mg at 05/25/16 4098     Discharge Medications: Please see discharge summary for a list of discharge medications.  Relevant Imaging  Results:  Relevant Lab Results:   Additional Information : Pt uses CPAP at night. SS # [id number]  Tayley Mudrick, Dickey Gave, LCSW

## 2016-05-25 NOTE — Evaluation (Addendum)
Physical Therapy Evaluation Patient Details Name: Emily Cross MRN: [id number] DOB: [birth year] Today's Date: 05/25/2016   History of Present Illness  Removal  painful Hrdware  right Knee from previous osteotomy in the past.   Clinical Impression  The  Patient tolerated mobility for bed change , Assisted to sitting  With 2 persons. Unable to tolerate standing today.Pt admitted with above diagnosis. Pt currently with functional limitations due to the deficits listed below (see PT Problem List).  Pt will benefit from skilled PT to increase their independence and safety with mobility to allow discharge to the venue listed below.       Follow Up Recommendations SNF;Supervision/Assistance - 24 hour    Equipment Recommendations  None recommended by PT    Recommendations for Other Services OT consult     Precautions / Restrictions Precautions Precautions: Fall Restrictions Weight Bearing Restrictions: No  Left knee has DJD     Mobility  Bed Mobility Overal bed mobility: Needs Assistance Bed Mobility: Supine to Sit;Sit to Supine     Supine to sit: Max assist;+2 for physical assistance;+2 for safety/equipment;HOB elevated Sit to supine: +2 for physical assistance;+2 for safety/equipment;Total assist   General bed mobility comments:  Rolled x 2 both directions with support of each leg to facilitate rolling and support  the surgical leg. Must move slowly  with the legs, Assist with the trunk , used the rail to self assist sliding as bed pad used to   scoot buttocks around,  Sat x 5 minutes, Trash can propped the legs as patient is short is stature.  Total assist to return to the bed in supine.  Transfers                 General transfer comment: NT  Ambulation/Gait                Stairs            Wheelchair Mobility    Modified Rankin (Stroke Patients Only)       Balance Overall balance assessment: Needs assistance Sitting-balance support: Bilateral upper  extremity supported;Feet supported Sitting balance-Leahy Scale: Fair                                       Pertinent Vitals/Pain Pain Assessment: 0-10 Pain Score: 10-Worst pain ever Pain Location: right knee Pain Descriptors / Indicators: Aching;Burning;Discomfort;Grimacing;Guarding Pain Intervention(s): Premedicated before session;Repositioned;Monitored during session    Home Living Family/patient expects to be discharged to:: Private residence Living Arrangements: Alone;Children Available Help at Discharge: Available PRN/intermittently Type of Home: House Home Access: Stairs to enter Entrance Stairs-Rails: Right Entrance Stairs-Number of Steps: 4 Home Layout: One level Home Equipment: Transport chair;Bedside commode      Prior Function Level of Independence: Needs assistance   Gait / Transfers Assistance Needed: assist with stairs  Was able to mange in the home for bathroom  Distances with RW.           Hand Dominance        Extremity/Trunk Assessment   Upper Extremity Assessment Upper Extremity Assessment: LUE deficits/detail LUE Deficits / Details: decreased elevation over heAD. aBLE TO PULL UP WITH rail at times            Communication   Communication: No difficulties  Cognition Arousal/Alertness: Awake/alert Behavior During Therapy: WFL for tasks assessed/performed Overall Cognitive Status: Within Functional Limits for tasks assessed  General Comments      Exercises     Assessment/Plan    PT Assessment Patient needs continued PT services  PT Problem List Decreased strength;Decreased range of motion;Decreased activity tolerance;Decreased mobility;Decreased knowledge of precautions;Decreased safety awareness;Decreased knowledge of use of DME          PT Treatment Interventions DME instruction;Gait training;Functional mobility training;Therapeutic activities;Patient/family education    PT Goals  (Current goals can be found in the Care Plan section)  Acute Rehab PT Goals Patient Stated Goal: to be able to walk PT Goal Formulation: With patient/family Time For Goal Achievement: 06/01/16 Potential to Achieve Goals: Fair    Frequency Min 5X/week (in case decides for DC home)   Barriers to discharge Decreased caregiver support;Inaccessible home environment      Co-evaluation               End of Session   Activity Tolerance: Patient tolerated treatment well Patient left: in bed;with call bell/phone within reach;with family/visitor present Nurse Communication: Mobility status    Functional Assessment Tool Used: clinical judgement Functional Limitation: Mobility: Walking and moving around Mobility: Walking and Moving Around Current Status (U9811(G8978): 100 percent impaired, limited or restricted Mobility: Walking and Moving Around Goal Status (B1478(G8979): At least 20 percent but less than 40 percent impaired, limited or restricted    Time: [id number] PT Time Calculation (min) (ACUTE ONLY): 62 min   Charges:   PT Evaluation $PT Eval Moderate Complexity: 1 Procedure PT Treatments $Therapeutic Activity: 38-52 mins   PT G Codes:   PT G-Codes **NOT FOR INPATIENT CLASS** Functional Assessment Tool Used: clinical judgement Functional Limitation: Mobility: Walking and moving around Mobility: Walking and Moving Around Current Status (Q6578(G8978): 100 percent impaired, limited or restricted Mobility: Walking and Moving Around Goal Status (I6962(G8979): At least 20 percent but less than 40 percent impaired, limited or restricted    Rada HayHill, Abdulwahab Demelo [name] 05/25/2016, 1:27 PM

## 2016-05-25 NOTE — Clinical Social Work Placement (Signed)
   CLINICAL SOCIAL WORK PLACEMENT  NOTE  Date:  05/25/2016  Patient Details  Name: Emily Cross MRN: [id number] Date of Birth: 12/13/1940  Clinical Social Work is seeking post-discharge placement for this patient at the Skilled  Nursing Facility level of care (*CSW will initial, date and re-position this form in  chart as items are completed):  Yes   Patient/family provided with [facility] Clinical Social Work Department's list of facilities offering this level of care within the geographic area requested by the patient (or if unable, by the patient's family).  Yes   Patient/family informed of their freedom to choose among providers that offer the needed level of care, that participate in Medicare, Medicaid or managed care program needed by the patient, have an available bed and are willing to accept the patient.  Yes   Patient/family informed of [facility]'s ownership interest in [facility] and [facility], as well as of the fact that they are under no obligation to receive care at these facilities.  PASRR submitted to EDS on       PASRR number received on       Existing PASRR number confirmed on 05/25/16     FL2 transmitted to all facilities in geographic area requested by pt/family on 05/25/16     FL2 transmitted to all facilities within larger geographic area on       Patient informed that his/her managed care company has contracts with or will negotiate with certain facilities, including the following:            Patient/family informed of bed offers received.  Patient chooses bed at       Physician recommends and patient chooses bed at      Patient to be transferred to   on  .  Patient to be transferred to facility by       Patient family notified on   of transfer.  Name of family member notified:        PHYSICIAN       Additional Comment:    _______________________________________________ Royetta AsalHaidinger, Palma Buster Lee, Alexander MtLCSW  [phone] 05/25/2016, 2:53 PM

## 2016-05-25 NOTE — Progress Notes (Signed)
Pt has been voiding incontinent. Two assist when changing pt and it is very painful for the patient. Obtained order for foley catheter from [facility]. Attempted three times to insert catheter by three different RNs. Resistance met with each insertion. Pt does not want another attempt at time. Will continue to monitor.

## 2016-05-25 NOTE — Progress Notes (Signed)
   Subjective: 1 Day Post-Op Procedure(s) (LRB): HARDWARE REMOVAL RIGHT KNEE (Right) Patient reports pain as moderate and severe.   Patient seen in rounds by Dr. Lequita HaltAluisio. Patient is having problems with pain in the leg, requiring pain medications We will start therapy today.  Pain issues last night.  Hopefully will do better today.  Objective: Vital signs in last 24 hours: Temp:  [97.5 F (36.4 C)-98.6 F (37 C)] 98.1 F (36.7 C) (02/08 0916) Pulse Rate:  [82-103] 93 (02/08 0916) Resp:  [11-20] 18 (02/08 0916) BP: (113-157)/(50-93) 152/64 (02/08 0916) SpO2:  [96 %-100 %] 98 % (02/08 0916) Weight:  [110.2 kg (243 lb)] 110.2 kg (243 lb) (02/07 1830)  Intake/Output from previous day: 02/07 0701 - 02/08 0700 In: 1625 [P.O.:180; I.V.:1245; IV Piggyback:200] Out: 400 [Urine:375; Blood:25] Intake/Output this shift: Total I/O In: 360 [P.O.:360] Out: -    Recent Labs  05/24/16 1855  HGB 9.5*    Recent Labs  05/24/16 1855  WBC 11.3*  RBC 3.96  HCT 30.5*  PLT 265    Recent Labs  05/24/16 1855  CREATININE 0.92   No results for input(s): LABPT, INR in the last 72 hours.  EXAM General - Patient is Alert and Appropriate Extremity - Neurovascular intact Sensation intact distally Dorsiflexion/Plantar flexion intact Dressing - dressing C/D/I Motor Function - intact, moving foot and toes well on exam.   Past Medical History:  Diagnosis Date  . Anemia   . Anxiety   . Arthritis    oa and ra  . CHF (congestive heart failure) (HCC)   . COPD (chronic obstructive pulmonary disease) (HCC)   . Dementia    mild  . Depression   . Diabetes mellitus without complication (HCC)    type 2  . Dyspnea    with exertion  . Emphysema of lung (HCC)   . Fibromyalgia   . GERD (gastroesophageal reflux disease)   . Headache   . Heart valve regurgitation   . High cholesterol   . Hyperlipidemia   . Hypertension   . Hypothyroidism   . Osteoporosis   . Pneumonia yrs ago  .  Sleep apnea   . Tinnitus    both ears  . Vitamin D deficiency     Assessment/Plan: 1 Day Post-Op Procedure(s) (LRB): HARDWARE REMOVAL RIGHT KNEE (Right) Principal Problem:   OA (osteoarthritis) of knee  Estimated body mass index is 49.08 kg/m as calculated from the following:   Height as of this encounter: 4\' 11"  (1.499 m).   Weight as of this encounter: 110.2 kg (243 lb). Up with therapy  DVT Prophylaxis - Lovenox Weight-Bearing as tolerated to right leg D/C O2 and Pulse OX and try on Room Air  Avel Peacerew Perkins, PA-C Orthopaedic Surgery 05/25/2016, 9:24 AM

## 2016-05-25 NOTE — Clinical Social Work Note (Signed)
Clinical Social Work Assessment  Patient Details  Name: Emily Cross MRN: [id number] Date of Birth: 1940-07-10  Date of referral:  05/25/16               Reason for consult:  Discharge Planning                Permission sought to share information with:  Facility Art therapist granted to share information::  Yes, Verbal Permission Granted  Name::        Agency::     Relationship::     Contact Information:     Housing/Transportation Living arrangements for the past 2 months:  [place] of Information:  Patient, Adult Children Patient Interpreter Needed:  None Criminal Activity/Legal Involvement Pertinent to Current Situation/Hospitalization:  No - Comment as needed Significant Relationships:  Adult Children Lives with:  Self Do you feel safe going back to the place where you live?   (SNF recommended.) Need for family participation in patient care:  Yes (Comment)  Care giving concerns:  Pt's care cannot be managed at home following hospital d/c.   Social Worker assessment / plan:  Pt hospitalized on 05/24/16 for pre planned Right knee osteoarthritis with retained hardware. Surgery to remove hardware completed. PT has recommended ST Rehab at d/c. CSW has met with pt / daughter to assist with d/c planning. Pt / daughter are in agreement with plan for rehab and SNF search has been initiated. Bed offers have been provided and pt / family are considering options. CSW will continue to follow to assist with d/c planning needs.  Employment status:  Retired Nurse, adult PT Recommendations:  [place] / Referral to community resources:  [place]  Patient/Family's Response to care:  Pt / daughter are in agreement with [facility] placement.  Patient/Family's Understanding of and Emotional Response to Diagnosis, Current Treatment, and Prognosis:  Pt / daughter are aware of pt's medical  status. Pt is disappointed that she will need ST rehab. Pt was hoping to return home at d/c. Support provided.  Emotional Assessment Appearance:  Appears stated age Attitude/Demeanor/Rapport:  Other (cooperative) Affect (typically observed):  Pleasant, Appropriate Orientation:  Oriented to Self, Oriented to Place, Oriented to  Time, Oriented to Situation Alcohol / Substance use:  Not Applicable Psych involvement (Current and /or in the community):  No (Comment)  Discharge Needs  Concerns to be addressed:  Discharge Planning Concerns Readmission within the last 30 days:  No Current discharge risk:  None Barriers to Discharge:  No Barriers Identified   Luretha Rued, [place] 05/25/2016, 2:38 PM

## 2016-05-25 NOTE — Care Management Note (Signed)
Case Management Note  Patient Details  Name: Emily Cross MRN: [id number] Date of Birth: 10/13/1940  Subjective/Objective:                  HARDWARE REMOVAL RIGHT KNEE (Right) Action/Plan: Discharge planning Expected Discharge Date:                  Expected Discharge Plan:  Skilled Nursing Facility  In-House Referral:     Discharge planning Services  CM Consult  Post Acute Care Choice:    Choice offered to:  Adult Children, Patient  DME Arranged:    DME Agency:  NA  HH Arranged:  NA HH Agency:  NA  Status of Service:  In process, will continue to follow  If discussed at Long Length of Stay Meetings, dates discussed:    Additional Comments: CM confirms with family SNF is recc and family requests SNF rehab; CSW aware and arranging. CM will follow for change of plan. Yves DillJeffries, Railynn Ballo Christine, RN 05/25/2016, 12:15 PM

## 2016-05-26 DIAGNOSIS — Z472 Encounter for removal of internal fixation device: Secondary | ICD-10-CM | POA: Diagnosis not present

## 2016-05-26 LAB — GLUCOSE, CAPILLARY
GLUCOSE-CAPILLARY: 111 mg/dL — AB (ref 65–99)
GLUCOSE-CAPILLARY: 111 mg/dL — AB (ref 65–99)
Glucose-Capillary: 102 mg/dL — ABNORMAL HIGH (ref 65–99)

## 2016-05-26 MED ORDER — ENOXAPARIN SODIUM 40 MG/0.4ML ~~LOC~~ SOLN: 40.0000 mg | SUBCUTANEOUS | 0 refills | Status: AC

## 2016-05-26 MED ORDER — HYDROMORPHONE HCL 2 MG PO TABS: 2.0000 mg | ORAL_TABLET | ORAL | 0 refills | Status: AC | PRN

## 2016-05-26 MED ORDER — METHOCARBAMOL 500 MG PO TABS: 500.0000 mg | ORAL_TABLET | Freq: Four times a day (QID) | ORAL | 0 refills | Status: AC | PRN

## 2016-05-26 NOTE — Progress Notes (Addendum)
   Subjective: 2 Days Post-Op Procedure(s) (LRB): HARDWARE REMOVAL RIGHT KNEE (Right) Patient reports pain as mild.   Patient seen in rounds with Dr. Lequita HaltAluisio.  Daughter in room at bedside. Briefly discussed disposition issues.  Looking into skilled rehab.  Will see how she does with therapy this morning and make final decision on disposition. Patient is well, but has had some minor complaints of pain in the leg, requiring pain medications.  Objective: Vital signs in last 24 hours: Temp:  [98.1 F (36.7 C)-98.7 F (37.1 C)] 98.7 F (37.1 C) (02/09 0624) Pulse Rate:  [85-97] 97 (02/09 0624) Resp:  [17-18] 17 (02/09 0624) BP: (134-152)/(53-72) 134/72 (02/09 0624) SpO2:  [95 %-98 %] 95 % (02/09 0624)  Intake/Output from previous day:  Intake/Output Summary (Last 24 hours) at 05/26/16 0824 Last data filed at 05/26/16 0626  Gross per 24 hour  Intake            699.5 ml  Output                0 ml  Net            699.5 ml    Intake/Output this shift: No intake/output data recorded.  Labs:  Recent Labs  05/24/16 1855  HGB 9.5*    Recent Labs  05/24/16 1855  WBC 11.3*  RBC 3.96  HCT 30.5*  PLT 265    Recent Labs  05/24/16 1855  CREATININE 0.92   No results for input(s): LABPT, INR in the last 72 hours.  EXAM: General - Patient is Alert and Appropriate Extremity - Neurovascular intact Sensation intact distally Dorsiflexion/Plantar flexion intact Incision - clean, dry, no drainage, staples intact Motor Function - intact, moving foot and toes well on exam.   Assessment/Plan: 2 Days Post-Op Procedure(s) (LRB): HARDWARE REMOVAL RIGHT KNEE (Right) Procedure(s) (LRB): HARDWARE REMOVAL RIGHT KNEE (Right) Past Medical History:  Diagnosis Date  . Anemia   . Anxiety   . Arthritis    oa and ra  . CHF (congestive heart failure) (HCC)   . COPD (chronic obstructive pulmonary disease) (HCC)   . Dementia    mild  . Depression   . Diabetes mellitus without  complication (HCC)    type 2  . Dyspnea    with exertion  . Emphysema of lung (HCC)   . Fibromyalgia   . GERD (gastroesophageal reflux disease)   . Headache   . Heart valve regurgitation   . High cholesterol   . Hyperlipidemia   . Hypertension   . Hypothyroidism   . Osteoporosis   . Pneumonia yrs ago  . Sleep apnea   . Tinnitus    both ears  . Vitamin D deficiency    Principal Problem:   OA (osteoarthritis) of knee  Estimated body mass index is 49.08 kg/m as calculated from the following:   Height as of this encounter: 4\' 11"  (1.499 m).   Weight as of this encounter: 110.2 kg (243 lb). Up with therapy Diet - Cardiac diet and Diabetic diet Follow up - in 2 weeks Activity - WBAT Disposition - pending, probably skilled rehab Condition Upon Discharge - Stable D/C Meds - See DC Summary DVT Prophylaxis - Lovenox for ten days and then Aspirin 81 mg daily for 4 weeks.  Avel Peacerew Ajia Chadderdon, PA-C Orthopaedic Surgery 05/26/2016, 8:24 AM    Addendum: Poor progress with therapy. Felt best that she go to a SNF for further rehab.  Avel Peacerew Jamilette Suchocki, [facility]

## 2016-05-26 NOTE — Discharge Summary (Signed)
Physician Discharge Summary   Patient ID: Emily Cross MRN: [id number] DOB/AGE: 1940-10-14 76 y.o.  Admit date: 05/24/2016 Discharge date: 05/26/2016   Primary Diagnosis:  Retained hardware, right knee.  Admission Diagnoses:  Past Medical History:  Diagnosis Date  . Anemia   . Anxiety   . Arthritis    oa and ra  . CHF (congestive heart failure) ([place])   . COPD (chronic obstructive pulmonary disease) ([place])   . Dementia    mild  . Depression   . Diabetes mellitus without complication ([place])    type 2  . Dyspnea    with exertion  . Emphysema of lung ([place])   . Fibromyalgia   . GERD (gastroesophageal reflux disease)   . Headache   . Heart valve regurgitation   . High cholesterol   . Hyperlipidemia   . Hypertension   . Hypothyroidism   . Osteoporosis   . Pneumonia yrs ago  . Sleep apnea   . Tinnitus    both ears  . Vitamin D deficiency    Discharge Diagnoses:   Principal Problem:   OA (osteoarthritis) of knee  Estimated body mass index is 49.08 kg/m as calculated from the following:   Height as of this encounter: _0  (1.499 m).   Weight as of this encounter: 110.2 kg (243 lb).  Procedure:  Procedure(s) (LRB): HARDWARE REMOVAL RIGHT KNEE (Right)   Consults: None  HPI: Ms. Gilpin is a 76 year old female who had a high tibial osteotomy done several years ago in treatment for her arthritis.  She has gone on to develop end-stage arthritis of the right knee, which will necessitate a total knee arthroplasty.  She has retained hardware which appears to be a locking [employer] plate from her osteotomy.  That needs to be removed and the wound needs to heal prior to performing the total knee arthroplasty.  She presents now for hardware removal.  Laboratory Data: Admission on 05/24/2016  Component Date Value Ref Range Status  . Glucose-Capillary 05/24/2016 92  65 - 99 mg/dL Final  . Comment 1 05/24/2016 Notify RN   Final  . Glucose-Capillary 05/24/2016 81  65 - 99 mg/dL  Final  . Glucose-Capillary 05/24/2016 130* 65 - 99 mg/dL Final  . Comment 1 05/24/2016 Document in Chart   Final  . WBC 05/24/2016 11.3* 4.0 - 10.5 K/uL Final  . RBC 05/24/2016 3.96  3.87 - 5.11 MIL/uL Final  . Hemoglobin 05/24/2016 9.5* 12.0 - 15.0 g/dL Final  . HCT 05/24/2016 30.5* 36.0 - 46.0 % Final  . MCV 05/24/2016 77.0* 78.0 - 100.0 fL Final  . MCH 05/24/2016 24.0* 26.0 - 34.0 pg Final  . MCHC 05/24/2016 31.1  30.0 - 36.0 g/dL Final  . RDW 05/24/2016 18.2* 11.5 - 15.5 % Final  . Platelets 05/24/2016 265  150 - 400 K/uL Final  . Creatinine, Ser 05/24/2016 0.92  0.44 - 1.00 mg/dL Final  . GFR calc non Af Amer 05/24/2016 59* >60 mL/min Final  . GFR calc Af Amer 05/24/2016 >60  >60 mL/min Final   Comment: (NOTE) The eGFR has been calculated using the CKD EPI equation. This calculation has not been validated in all clinical situations. eGFR's persistently <60 mL/min signify possible Chronic Kidney Disease.   . Glucose-Capillary 05/24/2016 144* 65 - 99 mg/dL Final  . Glucose-Capillary 05/25/2016 119* 65 - 99 mg/dL Final  . Glucose-Capillary 05/25/2016 120* 65 - 99 mg/dL Final  . Glucose-Capillary 05/25/2016 119* 65 - 99 mg/dL Final  .  Glucose-Capillary 05/25/2016 128* 65 - 99 mg/dL Final  . Glucose-Capillary 05/26/2016 111* 65 - 99 mg/dL Final  Hospital Outpatient Visit on 05/16/2016  Component Date Value Ref Range Status  . Glucose-Capillary 05/16/2016 121* 65 - 99 mg/dL Final  . Hgb A1c MFr Bld 05/16/2016 6.0* 4.8 - 5.6 % Final   Comment: (NOTE)         Pre-diabetes: 5.7 - 6.4         Diabetes: >6.4         Glycemic control for adults with diabetes: <7.0   . Mean Plasma Glucose 05/16/2016 126  mg/dL Final   Comment: (NOTE) Performed At: [facility] [place], [place] [id number] Lindon Romp MD [id number]   . Sodium 05/16/2016 142  135 - 145 mmol/L Final  . Potassium 05/16/2016 3.8  3.5 - 5.1 mmol/L Final  . Chloride 05/16/2016 106  101 - 111  mmol/L Final  . CO2 05/16/2016 27  22 - 32 mmol/L Final  . Glucose, Bld 05/16/2016 107* 65 - 99 mg/dL Final  . BUN 05/16/2016 13  6 - 20 mg/dL Final  . Creatinine, Ser 05/16/2016 0.94  0.44 - 1.00 mg/dL Final  . Calcium 05/16/2016 9.4  8.9 - 10.3 mg/dL Final  . GFR calc non Af Amer 05/16/2016 58* >60 mL/min Final  . GFR calc Af Amer 05/16/2016 >60  >60 mL/min Final   Comment: (NOTE) The eGFR has been calculated using the CKD EPI equation. This calculation has not been validated in all clinical situations. eGFR's persistently <60 mL/min signify possible Chronic Kidney Disease.   . Anion gap 05/16/2016 9  5 - 15 Final  . WBC 05/16/2016 9.1  4.0 - 10.5 K/uL Final  . RBC 05/16/2016 4.27  3.87 - 5.11 MIL/uL Final  . Hemoglobin 05/16/2016 10.4* 12.0 - 15.0 g/dL Final  . HCT 05/16/2016 32.2* 36.0 - 46.0 % Final  . MCV 05/16/2016 75.4* 78.0 - 100.0 fL Final  . MCH 05/16/2016 24.4* 26.0 - 34.0 pg Final  . MCHC 05/16/2016 32.3  30.0 - 36.0 g/dL Final  . RDW 05/16/2016 18.5* 11.5 - 15.5 % Final  . Platelets 05/16/2016 267  150 - 400 K/uL Final     X-Rays:No results found.  EKG: Orders placed or performed during the hospital encounter of 10/21/14  . EKG 12-Lead  . EKG 12-Lead  . EKG 12-Lead  . EKG 12-Lead  . EKG     Hospital Course: TAMI BLASS is a 76 y.o. who was admitted to [facility]. They were brought to the operating room on 05/24/2016 and underwent Procedure(s): [place].  Patient tolerated the procedure well and was later transferred to the recovery room and then to the orthopaedic floor for postoperative care.  They were given PO and IV analgesics for pain control following their surgery.  They were given 24 hours of postoperative antibiotics of  Anti-infectives    Start     Dose/Rate Route Frequency Ordered Stop   05/24/16 2200  ceFAZolin (ANCEF) IVPB 2g/100 mL premix     2 g 200 mL/hr over 30 Minutes Intravenous Every 6 hours 05/24/16 1827  05/25/16 1107   05/24/16 1140  ceFAZolin (ANCEF) IVPB 2g/100 mL premix     2 g 200 mL/hr over 30 Minutes Intravenous On call to O.R. 05/24/16 1140 05/24/16 1531     and started on DVT prophylaxis in the form of Lovenox.   PT was ordered for gait training  and ambulation.  Discharge planning consulted to help with postop disposition and equipment needs.  Patient had a very tough night on the evening of surgery with pain.  They started to get up OOB with therapy on day one. Unable to tolerate standing with therapy and required +2 for physical assistance.  Due to lack of improvement with therapy, social worker consulted to assist with possible need for placement.   Continued to work with therapy into day two.  Dressing was changed on day two and the incision was healing well.  Patient did not progress well with therapy.  Patient was seen in rounds and was setup to go to SNF of choice.    Diet - Cardiac diet and Diabetic diet Follow up - in 2 weeks Activity - WBAT Disposition - [facility]in Flock Condition Upon Discharge - Stable D/C Meds - See DC Summary DVT Prophylaxis - Lovenox for ten days and then Aspirin 81 mg daily for 4 weeks.  Discharge Instructions    Call MD / Call 911    Complete by:  As directed    If you experience chest pain or shortness of breath, CALL 911 and be transported to the hospital emergency room.  If you develope a fever above 101 F, pus (white drainage) or increased drainage or redness at the wound, or calf pain, call your surgeon's office.   Change dressing    Complete by:  As directed    Change dressing daily with sterile 4 x 4 inch gauze dressing and apply TED hose. Do not submerge the incision under water.   Constipation Prevention    Complete by:  As directed    Drink plenty of fluids.  Prune juice may be helpful.  You may use a stool softener, such as Colace (over the counter) 100 mg twice a day.  Use MiraLax (over the counter) for constipation as needed.   Diet - low  sodium heart healthy    Complete by:  As directed    Diet Carb Modified    Complete by:  As directed    Discharge instructions    Complete by:  As directed    Pick up stool softner and laxative for home use following surgery while on pain medications. Do not submerge incision under water. Please use good hand washing techniques while changing dressing each day. May shower starting three days after surgery. Please use a clean towel to pat the incision dry following showers. Continue to use ice for pain and swelling after surgery. Do not use any lotions or creams on the incision until instructed by your surgeon.  Wear both TED hose on both legs during the day every day for three weeks, but may have off at night at home.  Postoperative Constipation Protocol  Constipation - defined medically as fewer than three stools per week and severe constipation as less than one stool per week.  One of the most common issues patients have following surgery is constipation.  Even if you have a regular bowel pattern at home, your normal regimen is likely to be disrupted due to multiple reasons following surgery.  Combination of anesthesia, postoperative narcotics, change in appetite and fluid intake all can affect your bowels.  In order to avoid complications following surgery, here are some recommendations in order to help you during your recovery period.  Colace (docusate) - Pick up an over-the-counter form of Colace or another stool softener and take twice a day as long as you are requiring postoperative  pain medications.  Take with a full glass of water daily.  If you experience loose stools or diarrhea, hold the colace until you stool forms back up.  If your symptoms do not get better within 1 week or if they get worse, check with your doctor.  Dulcolax (bisacodyl) - Pick up over-the-counter and take as directed by the product packaging as needed to assist with the movement of your bowels.  Take with a  full glass of water.  Use this product as needed if not relieved by Colace only.   MiraLax (polyethylene glycol) - Pick up over-the-counter to have on hand.  MiraLax is a solution that will increase the amount of water in your bowels to assist with bowel movements.  Take as directed and can mix with a glass of water, juice, soda, coffee, or tea.  Take if you go more than two days without a movement. Do not use MiraLax more than once per day. Call your doctor if you are still constipated or irregular after using this medication for 7 days in a row.  If you continue to have problems with postoperative constipation, please contact the office for further assistance and recommendations.  If you experience "the worst abdominal pain ever" or develop nausea or vomiting, please contact the office immediatly for further recommendations for treatment.   Take Lovenox injections for 10 days and then discontinue the Lovenox and resume the baby 81 mg Aspirin daily.    Do not put a pillow under the knee. Place it under the heel.    Complete by:  As directed    Do not sit on low chairs, stoools or toilet seats, as it may be difficult to get up from low surfaces    Complete by:  As directed    Driving restrictions    Complete by:  As directed    No driving until released by the physician.   Increase activity slowly as tolerated    Complete by:  As directed    Lifting restrictions    Complete by:  As directed    No lifting until released by the physician.   Patient may shower    Complete by:  As directed    You may shower without a dressing once there is no drainage.  Do not wash over the wound.  If drainage remains, do not shower until drainage stops.   TED hose    Complete by:  As directed    Use stockings (TED hose) for 3 weeks on both leg(s).  You may remove them at night for sleeping.   Weight bearing as tolerated    Complete by:  As directed    Laterality:  right   Extremity:  Lower      Allergies as of 05/26/2016      Reactions   Asa [aspirin] Itching   Captopril Other (See Comments)   unknown   Celebrex [celecoxib] Hives, Itching, Swelling   Diphenhydramine Itching, Swelling   Prednisone Other (See Comments)   Unknown    Propofol Swelling   Tiotropium Bromide [tiotropium] Other (See Comments)   Unknown       Medication List    STOP taking these medications   aspirin EC 81 MG tablet   lidocaine 5 % Commonly known as:  LIDODERM     TAKE these medications   acetaminophen 325 MG tablet Commonly known as:  TYLENOL Take 650 mg by mouth every 6 (six) hours as needed for mild pain.  albuterol 108 (90 Base) MCG/ACT inhaler Commonly known as:  PROVENTIL HFA;VENTOLIN HFA Inhale 2 puffs into the lungs every 4 (four) hours as needed for wheezing.   atorvastatin 10 MG tablet Commonly known as:  LIPITOR Take 10 mg by mouth daily.   carvedilol 3.125 MG tablet Commonly known as:  COREG Take 3.125 mg by mouth 2 (two) times daily with a meal.   enoxaparin 40 MG/0.4ML injection Commonly known as:  LOVENOX Inject 0.4 mLs (40 mg total) into the skin daily. Take Lovenox injections daily for ten days and then discontinue the Lovenox and resume the baby 81 mg Aspirin daily.   esomeprazole 40 MG capsule Commonly known as:  NEXIUM Take 40 mg by mouth 2 (two) times daily.   ferrous sulfate 325 (65 FE) MG tablet Take 325 mg by mouth daily with breakfast.   fluticasone 50 MCG/ACT nasal spray Commonly known as:  FLONASE Place 1 spray into both nostrils daily as needed for rhinitis.   Fluticasone-Salmeterol 500-50 MCG/DOSE Aepb Commonly known as:  ADVAIR Inhale 1 puff into the lungs 2 (two) times daily as needed (shortness [id number]).   furosemide 40 MG tablet Commonly known as:  LASIX Take 40 mg by mouth daily.   HYDROmorphone 2 MG tablet Commonly known as:  DILAUDID Take 1-2 tablets (2-4 mg total) by mouth every 4 (four) hours as needed for moderate pain or  severe pain.   isosorbide dinitrate 20 MG tablet Commonly known as:  ISORDIL Take 20 mg by mouth 2 (two) times daily.   latanoprost 0.005 % ophthalmic solution Commonly known as:  XALATAN Place 1 drop into both eyes at bedtime.   levothyroxine 175 MCG tablet Commonly known as:  SYNTHROID, LEVOTHROID Take 175 mcg by mouth daily before breakfast.   loratadine 10 MG tablet Commonly known as:  CLARITIN Take 10 mg by mouth daily as needed for allergies. Every morning prn   losartan 25 MG tablet Commonly known as:  COZAAR Take 25 mg by mouth every evening.   memantine 7 MG Cp24 24 hr capsule Commonly known as:  NAMENDA XR Take 7 mg by mouth daily.   metFORMIN 1000 MG tablet Commonly known as:  GLUCOPHAGE Take 1,000 mg by mouth 2 (two) times daily with a meal.   methocarbamol 500 MG tablet Commonly known as:  ROBAXIN Take 1 tablet (500 mg total) by mouth every 6 (six) hours as needed for muscle spasms.   nystatin cream Commonly known as:  MYCOSTATIN Apply 1 application topically 2 (two) times daily as needed for dry skin.   polyethylene glycol packet Commonly known as:  MIRALAX / GLYCOLAX Take 17 g by mouth daily as needed for mild constipation.   potassium chloride 20 MEQ packet Commonly known as:  KLOR-CON Take 20 mEq by mouth daily.   pregabalin 100 MG capsule Commonly known as:  LYRICA Take 100 mg by mouth 2 (two) times daily.   venlafaxine XR 37.5 MG 24 hr capsule Commonly known as:  EFFEXOR-XR Take 37.5 mg by mouth daily with breakfast.   Vitamin D3 2000 units Tabs Take 1 tablet by mouth daily.      Follow-up Information    AGearlean Alf MD. Schedule an appointment as soon as possible for a visit on 06/06/2016.   Specialty:  Orthopedic Surgery Contact information: [phone]          Signed: DArlee Muslim PA-C Orthopaedic Surgery 05/26/2016, 8:37 AM

## 2016-05-26 NOTE — Progress Notes (Signed)
Attempted to call report to [facility] [phone]. Got a busy signal. Unable to connect with a person and/or leave a message.

## 2016-05-26 NOTE — Clinical Social Work Placement (Signed)
   CLINICAL SOCIAL WORK PLACEMENT  NOTE  Date:  05/26/2016  Patient Details  Name: Emily Cross MRN: [id number] Date of Birth: 02/23/1941  Clinical Social Work is seeking post-discharge placement for this patient at the Skilled  Nursing Facility level of care (*CSW will initial, date and re-position this form in  chart as items are completed):  Yes   Patient/family provided with [facility] Clinical Social Work Department's list of facilities offering this level of care within the geographic area requested by the patient (or if unable, by the patient's family).  Yes   Patient/family informed of their freedom to choose among providers that offer the needed level of care, that participate in Medicare, Medicaid or managed care program needed by the patient, have an available bed and are willing to accept the patient.  Yes   Patient/family informed of [facility]'s ownership interest [facility] and [facility], as well as of the fact that they are under no obligation to receive care at these facilities.  PASRR submitted to EDS on       PASRR number received on       Existing PASRR number confirmed on 05/25/16     FL2 transmitted to all facilities in geographic area requested by pt/family on 05/25/16     FL2 transmitted to all facilities within larger geographic area on       Patient informed that his/her managed care company has contracts with or will negotiate with certain facilities, including the following:        Yes   Patient/family informed of bed offers received.  [facility]     Physician recommends and patient chooses bed at      [facility] on 05/26/16.  Patient to be transferred to facility by [facility]     Patient family notified on 05/26/16 of transfer.  Name of family member notified:  DAUGHTER     PHYSICIAN       Additional Comment: Pt / daughter are in agreement with d/c to [facility] today. [facility]  transport is required. Medical necessity form completed. Pt / daughter are aware that out of pocket costs may be associated with [facility] transport. D/C Summary sent to SNF for review. Scripts included in d/c packet. # for report provided to nsg.   _______________________________________________ Royetta AsalHaidinger, Aylee Littrell Lee, LCSW  [phone] 05/26/2016, 1:29 PM

## 2016-05-26 NOTE — Care Management Obs Status (Signed)
MEDICARE OBSERVATION STATUS NOTIFICATION   Patient Details  Name: Emily Cross MRN: [id number] Date of Birth: 03/21/1941   Medicare Observation Status Notification Given:  Yes    Alexis Goodelleele, Pairlee Sawtell K, RN 05/26/2016, 11:40 AM

## 2016-05-26 NOTE — Progress Notes (Signed)
Physical Therapy Treatment Patient Details Name: Emily Cross MRN: [id number] DOB: 01/06/1941 Today's Date: 05/26/2016    History of Present Illness Removal  painful Hrdware  right Knee from previous osteotomy in the past.     PT Comments    Assisted back to bed  Follow Up Recommendations  SNF     Equipment Recommendations       Recommendations for Other Services       Precautions / Restrictions Precautions Precautions: Fall Restrictions Weight Bearing Restrictions: No    Mobility  Bed Mobility Overal bed mobility: Needs Assistance Bed Mobility: Sit to Supine     Supine to sit: Max assist;+2 for physical assistance;+2 for safety/equipment;HOB elevated Sit to supine: Total assist;+2 for physical assistance;+2 for safety/equipment   General bed mobility comments: assisted back to bed  Transfers Overall transfer level: Needs assistance Equipment used: Bilateral platform walker (EVA walker) Transfers: Sit to/from Stand;Stand Pivot Transfers Sit to Stand: +2 safety/equipment;+2 physical assistance;From elevated surface;Max assist Stand pivot transfers: Max assist;+2 physical assistance;+2 safety/equipment       General transfer comment: assisted from recliner back to bed 1/4 pivot turn towards her L [facility]  Ambulation/Gait Ambulation/Gait assistance: Max assist;+2 physical assistance Ambulation Distance (Feet):  (a few side steps to [facility] Assistive device: Bilateral platform walker Gait Pattern/deviations: Step-to pattern;Decreased stance time - right     General Gait Details: great difficulty advancing either LE to progress gait   Stairs            Wheelchair Mobility    Modified Rankin (Stroke Patients Only)       Balance                                    Cognition Arousal/Alertness: Awake/alert Behavior During Therapy: WFL for tasks assessed/performed Overall Cognitive Status: Within Functional Limits for  tasks assessed                      Exercises      General Comments        Pertinent Vitals/Pain Pain Assessment: 0-10 Pain Score: 5  Pain Location: right knee Pain Descriptors / Indicators: Grimacing Pain Intervention(s): Monitored during session;Repositioned;Ice applied    Home Living                      Prior Function            PT Goals (current goals can now be found in the care plan section) Progress towards PT goals: Progressing toward goals    Frequency    Min 5X/week      PT Plan Current plan remains appropriate    Co-evaluation             End of Session Equipment Utilized During Treatment: Gait belt Activity Tolerance: Patient tolerated treatment well Patient left: in chair;with family/visitor present     Time: 1300-1325 PT Time Calculation (min) (ACUTE ONLY): 25 min  Charges:  2 ta                    G Codes:      Emily Cross  PTA [facility]  Acute  Rehab Pager      [phone]

## 2016-05-26 NOTE — Progress Notes (Signed)
Physical Therapy Treatment Patient Details Name: Emily Cross MRN: [id number] DOB: 06/13/1940 Today's Date: 05/26/2016    History of Present Illness Removal  painful Hrdware  right Knee from previous osteotomy in the past.     PT Comments    Pt required + 2 assist [facility] then [facility].  Required B platform EVA walker for increased support.  Pt was unable to functionally take steps to amb.  Recliner brought from behind as pt stood.     Follow Up Recommendations  SNF     Equipment Recommendations       Recommendations for Other Services       Precautions / Restrictions Precautions Precautions: Fall Restrictions Weight Bearing Restrictions: No    Mobility  Bed Mobility Overal bed mobility: Needs Assistance Bed Mobility: Supine to Sit     Supine to sit: Max assist;+2 for physical assistance;+2 for safety/equipment;HOB elevated     General bed mobility comments: increased time to assist with R LE mvy off bed and assist upper body to sit plus using bed pad to complete scooting to EOB.  Once upright, pt was able to static sit at Supervision level  Transfers Overall transfer level: Needs assistance Equipment used: Bilateral platform walker (EVA walker) Transfers: Sit to/from Stand;Stand Pivot Transfers Sit to Stand: +2 safety/equipment;+2 physical assistance;From elevated surface;Max assist Stand pivot transfers: Max assist;+2 physical assistance;+2 safety/equipment       General transfer comment: [facility]iner.  Pt was able to partially complete 1/4 pivot [facility] then off [facility] swithced recliner from pt behind.    Ambulation/Gait Ambulation/Gait assistance: Max assist;+2 physical assistance Ambulation Distance (Feet):  (2 very short steps) Assistive device: Bilateral platform walker Gait Pattern/deviations: Step-to pattern;Decreased stance time - right     General Gait Details: great difficulty  advancing either LE to progress gait   Stairs            Wheelchair Mobility    Modified Rankin (Stroke Patients Only)       Balance                                    Cognition Arousal/Alertness: Awake/alert Behavior During Therapy: WFL for tasks assessed/performed Overall Cognitive Status: Within Functional Limits for tasks assessed                      Exercises      General Comments        Pertinent Vitals/Pain Pain Assessment: 0-10 Pain Score: 5  Pain Location: right knee Pain Descriptors / Indicators: Grimacing Pain Intervention(s): Monitored during session;Repositioned;Ice applied    Home Living                      Prior Function            PT Goals (current goals can now be found in the care plan section) Progress towards PT goals: Progressing toward goals    Frequency    Min 5X/week      PT Plan Current plan remains appropriate    Co-evaluation             End of Session Equipment Utilized During Treatment: Gait belt Activity Tolerance: Patient tolerated treatment well Patient left: in chair;with family/visitor present     Time: [id number] PT Time Calculation (  min) (ACUTE ONLY): 40 min  Charges:  $Gait Training: 8-22 mins $Therapeutic Activity: 23-37 mins                    G Codes:      Rica Koyanagi  PTA [facility]  Acute  Rehab Pager      [phone]

## 2016-08-10 IMAGING — CR DG CHEST 2V
2 series · 2 of 2 positions shown · non-contrast
Comparison: August 13, 2014

CLINICAL DATA: Shortness of breath.

EXAM:
CHEST  2 VIEW

[w chest pa]
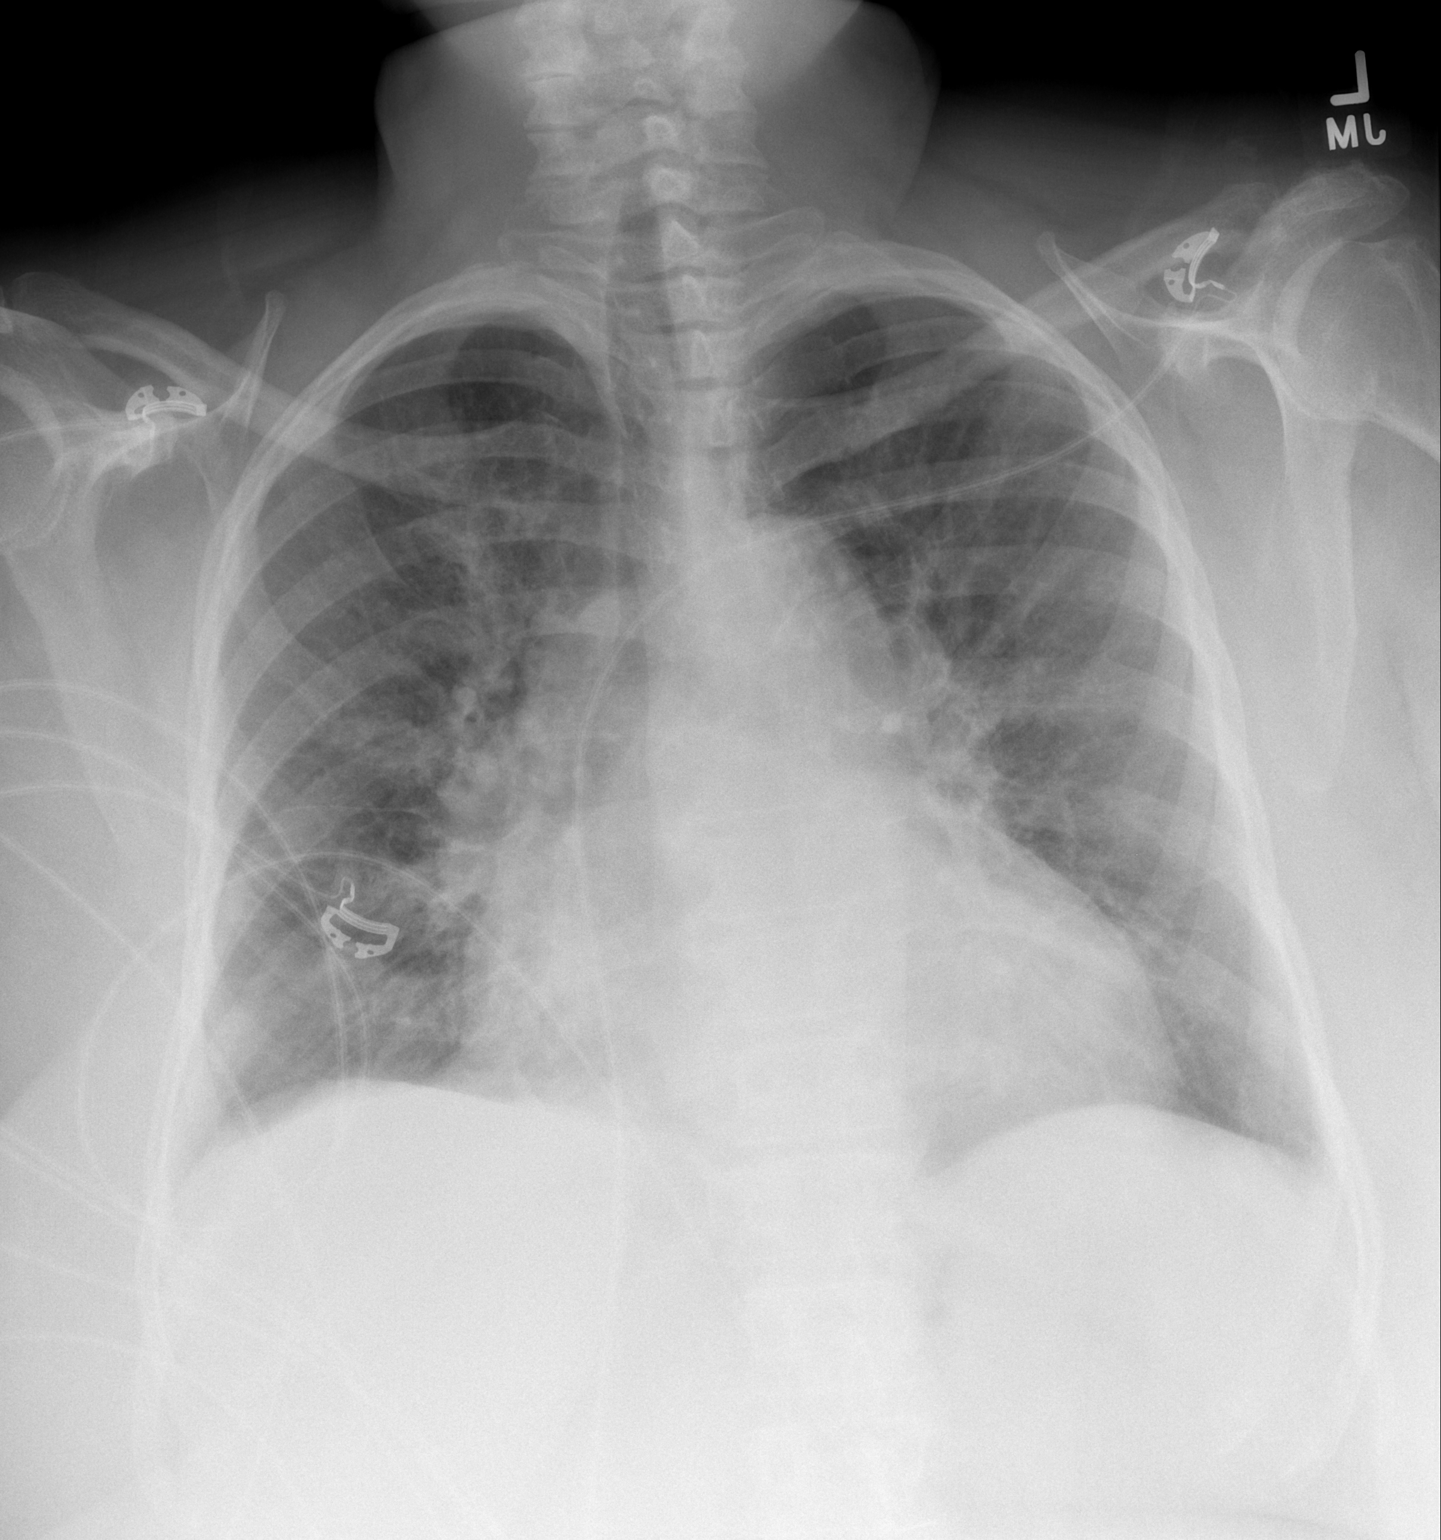

[w chest lat *]
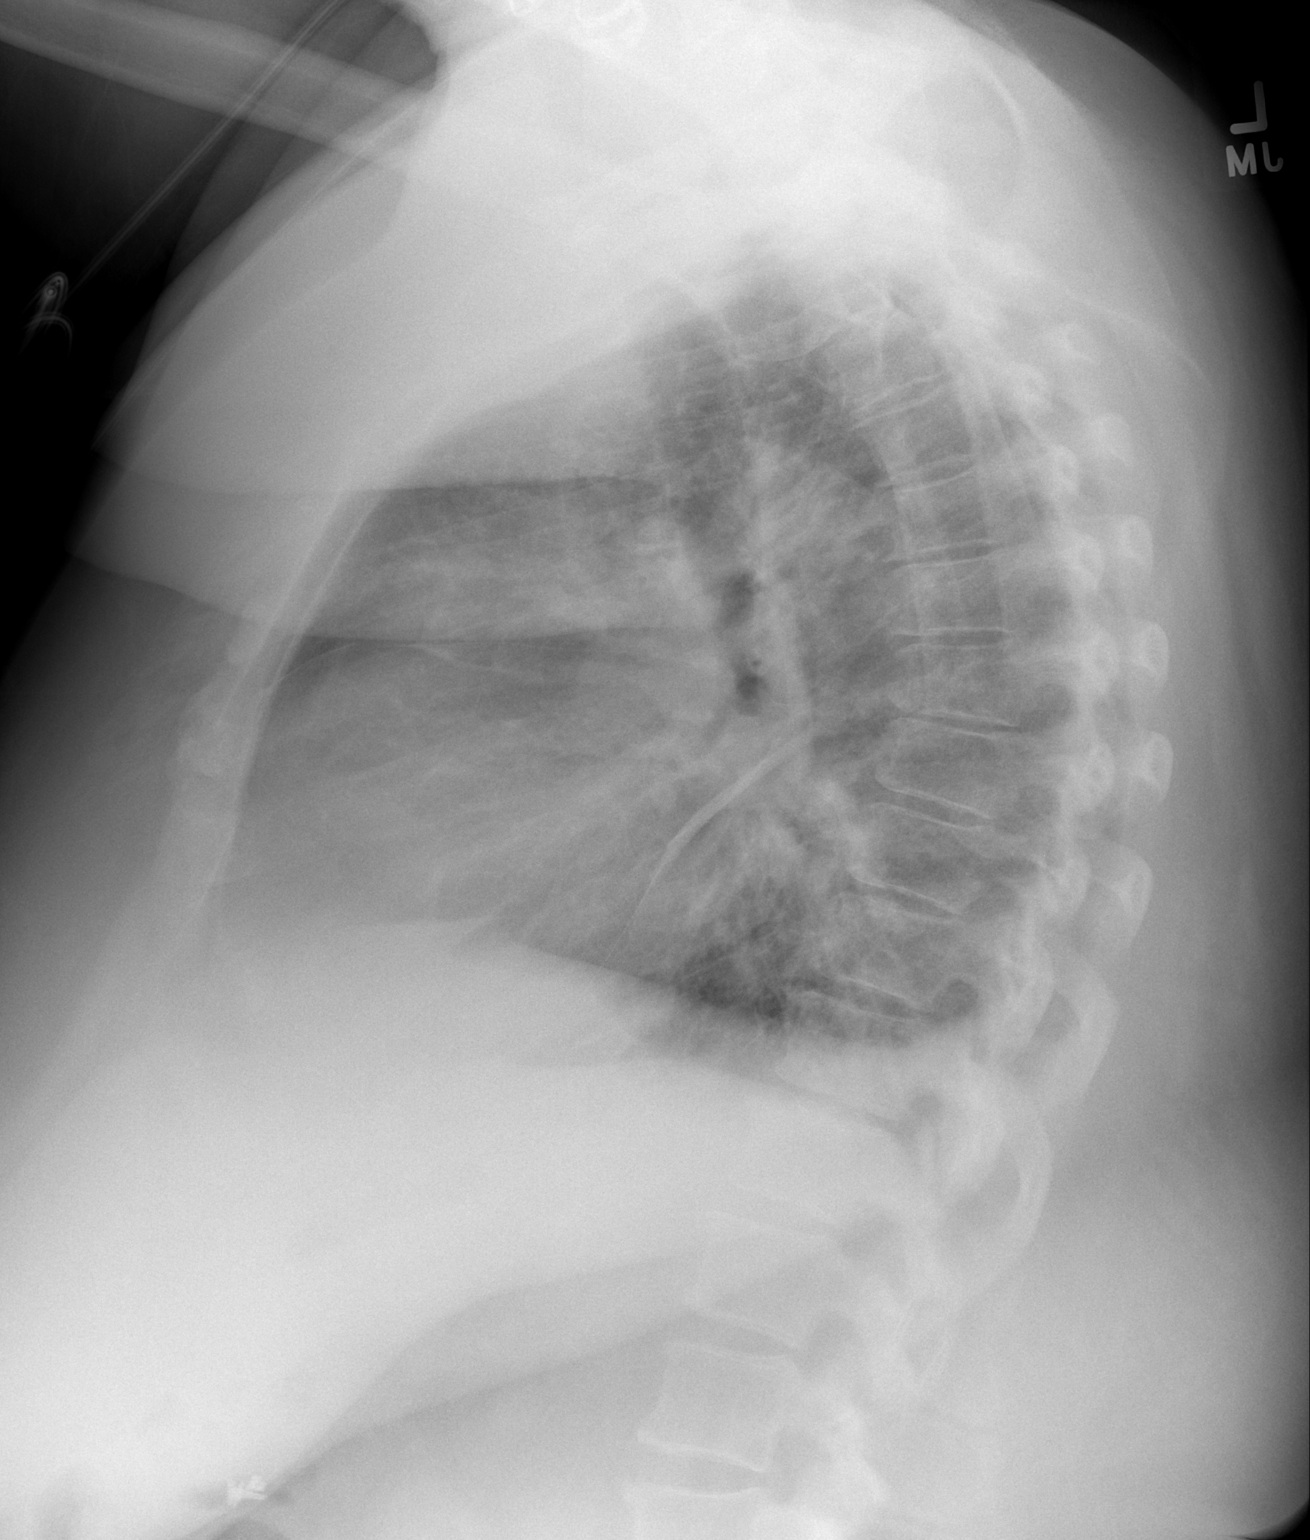

[2 of 2 positions shown; findings below may reference images not displayed]

FINDINGS: There is mild cardiomegaly with pulmonary venous hypertension. There
is no frank edema or consolidation. No adenopathy. No bone lesions.
IMPRESSION: Evidence of a degree of pulmonary vascular congestion without frank
edema or consolidation.

## 2016-09-18 NOTE — Addendum Note (Signed)
Addendum  created 09/18/16 1054 by Lyann Hagstrom, MD   Sign clinical note
